# Patient Record
Sex: Male | Born: 1945 | Race: White | Hispanic: No | Marital: Married | Smoking: Never smoker
Health system: Southern US, Community
[De-identification: ages and names within clinical notes are randomized; demographics above are authoritative.]

## PROBLEM LIST (undated history)

## (undated) DIAGNOSIS — M1990 Unspecified osteoarthritis, unspecified site: Secondary | ICD-10-CM

## (undated) DIAGNOSIS — I5189 Other ill-defined heart diseases: Secondary | ICD-10-CM

## (undated) DIAGNOSIS — K635 Polyp of colon: Secondary | ICD-10-CM

## (undated) DIAGNOSIS — Z87442 Personal history of urinary calculi: Secondary | ICD-10-CM

## (undated) DIAGNOSIS — I2510 Atherosclerotic heart disease of native coronary artery without angina pectoris: Secondary | ICD-10-CM

## (undated) DIAGNOSIS — I219 Acute myocardial infarction, unspecified: Secondary | ICD-10-CM

## (undated) DIAGNOSIS — G250 Essential tremor: Secondary | ICD-10-CM

## (undated) DIAGNOSIS — D72819 Decreased white blood cell count, unspecified: Secondary | ICD-10-CM

## (undated) DIAGNOSIS — R001 Bradycardia, unspecified: Secondary | ICD-10-CM

## (undated) DIAGNOSIS — I10 Essential (primary) hypertension: Secondary | ICD-10-CM

## (undated) DIAGNOSIS — R079 Chest pain, unspecified: Secondary | ICD-10-CM

## (undated) DIAGNOSIS — G4730 Sleep apnea, unspecified: Secondary | ICD-10-CM

## (undated) DIAGNOSIS — N433 Hydrocele, unspecified: Secondary | ICD-10-CM

## (undated) DIAGNOSIS — E785 Hyperlipidemia, unspecified: Secondary | ICD-10-CM

## (undated) HISTORY — PX: FOOT SURGERY: SHX648

## (undated) HISTORY — PX: OTHER SURGICAL HISTORY: SHX169

## (undated) HISTORY — PX: CARDIAC CATHETERIZATION: SHX172

---

## 2007-07-17 ENCOUNTER — Ambulatory Visit: Payer: Self-pay | Admitting: Unknown Physician Specialty

## 2008-06-20 ENCOUNTER — Ambulatory Visit: Payer: Self-pay | Admitting: Urology

## 2008-06-27 ENCOUNTER — Ambulatory Visit: Payer: Self-pay | Admitting: Urology

## 2012-01-19 ENCOUNTER — Ambulatory Visit: Payer: Self-pay | Admitting: Cardiology

## 2012-07-31 ENCOUNTER — Ambulatory Visit: Payer: Self-pay | Admitting: Unknown Physician Specialty

## 2013-11-13 HISTORY — PX: HYDROCELE EXCISION / REPAIR: SUR1145

## 2017-07-15 ENCOUNTER — Encounter: Payer: Self-pay | Admitting: Internal Medicine

## 2017-07-15 ENCOUNTER — Other Ambulatory Visit: Payer: Self-pay

## 2017-07-15 ENCOUNTER — Emergency Department: Payer: Medicare HMO

## 2017-07-15 ENCOUNTER — Inpatient Hospital Stay
Admission: EM | Admit: 2017-07-15 | Discharge: 2017-07-18 | DRG: 251 | Disposition: A | Discharge status: Other Acute Inpatient Hospital | Payer: Medicare HMO | Attending: Internal Medicine | Admitting: Internal Medicine

## 2017-07-15 DIAGNOSIS — I25119 Atherosclerotic heart disease of native coronary artery with unspecified angina pectoris: Secondary | ICD-10-CM | POA: Diagnosis present

## 2017-07-15 DIAGNOSIS — I214 Non-ST elevation (NSTEMI) myocardial infarction: Secondary | ICD-10-CM | POA: Diagnosis not present

## 2017-07-15 DIAGNOSIS — I208 Other forms of angina pectoris: Secondary | ICD-10-CM | POA: Diagnosis present

## 2017-07-15 DIAGNOSIS — E669 Obesity, unspecified: Secondary | ICD-10-CM | POA: Diagnosis present

## 2017-07-15 DIAGNOSIS — Z6830 Body mass index (BMI) 30.0-30.9, adult: Secondary | ICD-10-CM

## 2017-07-15 DIAGNOSIS — R402414 Glasgow coma scale score 13-15, 24 hours or more after hospital admission: Secondary | ICD-10-CM | POA: Diagnosis not present

## 2017-07-15 DIAGNOSIS — Y838 Other surgical procedures as the cause of abnormal reaction of the patient, or of later complication, without mention of misadventure at the time of the procedure: Secondary | ICD-10-CM | POA: Diagnosis not present

## 2017-07-15 DIAGNOSIS — Z79899 Other long term (current) drug therapy: Secondary | ICD-10-CM | POA: Diagnosis not present

## 2017-07-15 DIAGNOSIS — Z7982 Long term (current) use of aspirin: Secondary | ICD-10-CM | POA: Diagnosis not present

## 2017-07-15 DIAGNOSIS — E785 Hyperlipidemia, unspecified: Secondary | ICD-10-CM | POA: Diagnosis present

## 2017-07-15 DIAGNOSIS — I2510 Atherosclerotic heart disease of native coronary artery without angina pectoris: Secondary | ICD-10-CM | POA: Diagnosis not present

## 2017-07-15 DIAGNOSIS — R7989 Other specified abnormal findings of blood chemistry: Secondary | ICD-10-CM

## 2017-07-15 DIAGNOSIS — R778 Other specified abnormalities of plasma proteins: Secondary | ICD-10-CM

## 2017-07-15 DIAGNOSIS — I9751 Accidental puncture and laceration of a circulatory system organ or structure during a circulatory system procedure: Secondary | ICD-10-CM | POA: Diagnosis not present

## 2017-07-15 DIAGNOSIS — I10 Essential (primary) hypertension: Secondary | ICD-10-CM | POA: Diagnosis present

## 2017-07-15 DIAGNOSIS — R001 Bradycardia, unspecified: Secondary | ICD-10-CM | POA: Diagnosis present

## 2017-07-15 HISTORY — DX: Polyp of colon: K63.5

## 2017-07-15 HISTORY — DX: Hydrocele, unspecified: N43.3

## 2017-07-15 HISTORY — DX: Non-ST elevation (NSTEMI) myocardial infarction: I21.4

## 2017-07-15 HISTORY — DX: Hyperlipidemia, unspecified: E78.5

## 2017-07-15 HISTORY — DX: Bradycardia, unspecified: R00.1

## 2017-07-15 HISTORY — DX: Essential (primary) hypertension: I10

## 2017-07-15 HISTORY — DX: Chest pain, unspecified: R07.9

## 2017-07-15 HISTORY — DX: Decreased white blood cell count, unspecified: D72.819

## 2017-07-15 LAB — COMPREHENSIVE METABOLIC PANEL
ALBUMIN: 3.7 g/dL (ref 3.5–5.0)
ALK PHOS: 45 U/L (ref 38–126)
ALT: 19 U/L (ref 17–63)
ANION GAP: 9 (ref 5–15)
AST: 20 U/L (ref 15–41)
BILIRUBIN TOTAL: 0.7 mg/dL (ref 0.3–1.2)
BUN: 20 mg/dL (ref 6–20)
CALCIUM: 9.7 mg/dL (ref 8.9–10.3)
CO2: 24 mmol/L (ref 22–32)
Chloride: 106 mmol/L (ref 101–111)
Creatinine, Ser: 1.02 mg/dL (ref 0.61–1.24)
GFR calc non Af Amer: >60 mL/min (ref >60)
GLUCOSE: 109 mg/dL — AB (ref 65–99)
Potassium: 4.5 mmol/L (ref 3.5–5.1)
Sodium: 139 mmol/L (ref 135–145)
TOTAL PROTEIN: 6.7 g/dL (ref 6.5–8.1)

## 2017-07-15 LAB — TROPONIN I
TROPONIN I: 0.11 ng/mL — AB (ref <0.03)
Troponin I: 0.14 ng/mL (ref <0.03)

## 2017-07-15 LAB — CBC WITH DIFFERENTIAL/PLATELET
BASOS PCT: 1 %
Basophils Absolute: 0 10*3/uL (ref 0–0.1)
EOS ABS: 0.1 10*3/uL (ref 0–0.7)
Eosinophils Relative: 2 %
HEMATOCRIT: 44.3 % (ref 40.0–52.0)
Hemoglobin: 14.9 g/dL (ref 13.0–18.0)
Lymphocytes Relative: 23 %
Lymphs Abs: 1 10*3/uL (ref 1.0–3.6)
MCH: 32.5 pg (ref 26.0–34.0)
MCHC: 33.6 g/dL (ref 32.0–36.0)
MCV: 96.6 fL (ref 80.0–100.0)
MONO ABS: 0.3 10*3/uL (ref 0.2–1.0)
MONOS PCT: 6 %
NEUTROS ABS: 3.1 10*3/uL (ref 1.4–6.5)
Neutrophils Relative %: 68 %
Platelets: 262 10*3/uL (ref 150–440)
RBC: 4.59 MIL/uL (ref 4.40–5.90)
RDW: 13.6 % (ref 11.5–14.5)
WBC: 4.5 10*3/uL (ref 3.8–10.6)

## 2017-07-15 LAB — PROTIME-INR
INR: 1.01
Prothrombin Time: 13.2 seconds (ref 11.4–15.2)

## 2017-07-15 LAB — HEMOGLOBIN A1C
HEMOGLOBIN A1C: 5.2 % (ref 4.8–5.6)
Mean Plasma Glucose: 102.54 mg/dL

## 2017-07-15 LAB — APTT: APTT: 28 s (ref 24–36)

## 2017-07-15 MED ORDER — ALPRAZOLAM 0.25 MG PO TABS: 0.2500 mg | ORAL_TABLET | Freq: Two times a day (BID) | ORAL | Status: DC | PRN

## 2017-07-15 MED ORDER — HEPARIN (PORCINE) IN NACL 100-0.45 UNIT/ML-% IJ SOLN
1450.0000 [IU]/h | INTRAMUSCULAR | Status: DC
Start: 2017-07-15 — End: 2017-07-18
  Administered 2017-07-15: 1250 [IU]/h via INTRAVENOUS
  Administered 2017-07-16 – 2017-07-18 (×4): 1450 [IU]/h via INTRAVENOUS
  Filled 2017-07-15 (×5): qty 250

## 2017-07-15 MED ORDER — ACETAMINOPHEN 500 MG PO TABS
1000.0000 mg | ORAL_TABLET | Freq: Once | ORAL | Status: AC
  Administered 2017-07-15: 1000 mg via ORAL
  Filled 2017-07-15: qty 2

## 2017-07-15 MED ORDER — ATORVASTATIN CALCIUM 20 MG PO TABS
80.0000 mg | ORAL_TABLET | Freq: Every day | ORAL | Status: DC
  Administered 2017-07-15 – 2017-07-17 (×3): 80 mg via ORAL
  Filled 2017-07-15 (×2): qty 4

## 2017-07-15 MED ORDER — ASPIRIN 300 MG RE SUPP: 300.0000 mg | RECTAL | Status: AC

## 2017-07-15 MED ORDER — ONDANSETRON HCL 4 MG/2ML IJ SOLN: 4.0000 mg | Freq: Four times a day (QID) | INTRAMUSCULAR | Status: DC | PRN

## 2017-07-15 MED ORDER — NITROGLYCERIN 0.4 MG SL SUBL: 0.4000 mg | SUBLINGUAL_TABLET | SUBLINGUAL | Status: DC | PRN

## 2017-07-15 MED ORDER — ACETAMINOPHEN 325 MG PO TABS: 650.0000 mg | ORAL_TABLET | ORAL | Status: DC | PRN

## 2017-07-15 MED ORDER — ZOLPIDEM TARTRATE 5 MG PO TABS: 5.0000 mg | ORAL_TABLET | Freq: Every evening | ORAL | Status: DC | PRN

## 2017-07-15 MED ORDER — LORATADINE 10 MG PO TABS
10.0000 mg | ORAL_TABLET | Freq: Every day | ORAL | Status: DC
  Administered 2017-07-15 – 2017-07-17 (×3): 10 mg via ORAL
  Filled 2017-07-15 (×3): qty 1

## 2017-07-15 MED ORDER — ASPIRIN EC 81 MG PO TBEC
81.0000 mg | DELAYED_RELEASE_TABLET | Freq: Every day | ORAL | Status: DC
  Administered 2017-07-16 – 2017-07-17 (×2): 81 mg via ORAL
  Filled 2017-07-15 (×2): qty 1

## 2017-07-15 MED ORDER — ASPIRIN 81 MG PO CHEW: 324.0000 mg | CHEWABLE_TABLET | ORAL | Status: AC

## 2017-07-15 MED ORDER — HEPARIN BOLUS VIA INFUSION
4000.0000 [IU] | Freq: Once | INTRAVENOUS | Status: AC
  Administered 2017-07-15: 4000 [IU] via INTRAVENOUS
  Filled 2017-07-15: qty 4000

## 2017-07-15 NOTE — ED Triage Notes (Signed)
Pt presents today from home via [facility] for chest pain. Pt describes pain as elephant sitting on my chest. Pt states that pain is gone now. Pt received 300 ml of fl 324 asprin and 0.4 nitro sprays x3. Pt is NAD

## 2017-07-15 NOTE — ED Notes (Signed)
Date and time results received: 07/15/17 1511 (use smartphrase ".now" to insert current time)  Test: troponin Critical Value: 0.14  Name of Provider Notified: Quentin Cornwall

## 2017-07-15 NOTE — Progress Notes (Signed)
[facility] for Heparin Drip Indication: chest pain/ACS  Not on File  Patient Measurements: Height: 5\' 9"  (175.3 cm) Weight: 222 lb 14.4 oz (101.1 kg) IBW/kg (Calculated) : 70.7 Heparin Dosing Weight: 92 kg  Vital Signs: Temp: 97.9 F (36.6 C) (03/22 0951) Temp Source: Oral (03/22 0951) BP: 127/67 (03/22 1500) Pulse Rate: 51 (03/22 1500)  Labs: Recent Labs    07/15/17 0952 07/15/17 1436  HGB 14.9  --   HCT 44.3  --   PLT 262  --   CREATININE 1.02  --   TROPONINI <0.03 0.14*    Estimated Creatinine Clearance: 75.6 mL/min (by C-G formula based on SCr of 1.02 mg/dL).   Medical History: No past medical history on file.  Assessment: 72 y/o M admitted with CP not on anticoagulant PTA.   Goal of Therapy:  Heparin level 0.3-0.7 units/ml Monitor platelets by anticoagulation protocol: Yes   Plan:  Give 4000 units bolus x 1 Start heparin infusion at 1250 units/hr Check anti-Xa level in 8 hours and daily while on heparin Continue to monitor H&H and platelets  Ulice Dash D 07/15/2017,3:29 PM

## 2017-07-15 NOTE — Progress Notes (Signed)
Patient admitted to unit. Oriented to room, call bell, and staff. Bed in lowest position. Fall safety plan reviewed. Full assessment to Epic. Skin assessment verified with Georga Hacking RN. Telemetry box verification with tele clerk and Mykia NT- Box#: [id number] Will continue to monitor.

## 2017-07-15 NOTE — ED Provider Notes (Signed)
[facility] Emergency Department Provider Note    None    (approximate)  I have reviewed the triage vital signs and the nursing notes.   HISTORY  Chief Complaint Chest Pain    HPI Jeremiah [name] is a 72 y.o. male with a history of reflux as well as hypertension hypercholesterolemia but no personal history of cardiac disease presents with chief complaint of sudden onset of midsternal pressure described as an elephant sitting on his chest that lasted roughly 15-20 minutes.  Did feel flushed.  And the discomfort actually brought him down to the ground and knees.  Has never had pain like this quite before.  Took some Tums with no improvement.  He called EMS and EMS gave him nitroglycerin with improvement in symptoms.  Given aspirin in route.  States his pain is currently minimal on left side.  Denies any pain tearing or ripping through to his back.  No pain in his jaw.  No recent fevers.  He had a presentation to [facility] ER in 2016 where he was admitted for cardiac rule out and had negative dobutamine stress test and was released home but states that this symptom feels slightly different as he had more pressure in his chest and symptoms lasted longer.   No past medical history on file. No family history on file.  There are no active problems to display for this patient.     Prior to Admission medications   Medication Sig Start Date End Date Taking? Authorizing Provider  aspirin EC 81 MG tablet Take 81 mg by mouth daily.   Yes [provider]  cetirizine (ZYRTEC) 10 MG tablet Take 10 mg by mouth daily.   Yes [provider]  cholecalciferol (VITAMIN D) 1000 units tablet Take 1,000 Units by mouth daily.   Yes [provider]  Omega-3 Fatty Acids (FISH OIL) 1000 MG CAPS Take 1 capsule by mouth daily.   Yes [provider]  simvastatin (ZOCOR) 20 MG tablet Take 20 mg by mouth daily.   Yes [provider]  vitamin B-12  (CYANOCOBALAMIN) 1000 MCG tablet Take 1,000 mcg by mouth daily.   Yes [provider]    Allergies Patient has no allergy information on record.    Social History Social History   Tobacco Use  . Smoking status: Not on file  Substance Use Topics  . Alcohol use: Not on file  . Drug use: Not on file    Review of Systems Patient denies headaches, rhinorrhea, blurry vision, numbness, shortness of breath, chest pain, edema, cough, abdominal pain, nausea, vomiting, diarrhea, dysuria, fevers, rashes or hallucinations unless otherwise stated above in HPI. ____________________________________________   PHYSICAL EXAM:  VITAL SIGNS: Vitals:   07/15/17 1330 07/15/17 1500  BP: 139/80 127/67  Pulse: (!) 55 (!) 51  Resp: (!) 24 16  Temp:    SpO2: 96% 96%    Constitutional: Alert and oriented. Well appearing and in no acute distress. Eyes: Conjunctivae are normal.  Head: Atraumatic. Nose: No congestion/rhinnorhea. Mouth/Throat: Mucous membranes are moist.   Neck: No stridor. Painless ROM.  Cardiovascular: Normal rate, regular rhythm. Grossly normal heart sounds.  Good peripheral circulation. Respiratory: Normal respiratory effort.  No retractions. Lungs CTAB. Gastrointestinal: Soft and nontender. No distention. No abdominal bruits. No CVA tenderness. Genitourinary:  Musculoskeletal: No lower extremity tenderness nor edema.  No joint effusions. Neurologic:  Normal speech and language. No gross focal neurologic deficits are appreciated. No facial droop Skin:  Skin  is warm, dry and intact. No rash noted. Psychiatric: Mood and affect are normal. Speech and behavior are normal.  ____________________________________________   LABS (all labs ordered are listed, but only abnormal results are displayed)  Results for orders placed or performed during the hospital encounter of 07/15/17 (from the past 24 hour(s))  CBC with Differential/Platelet     Status: None   Collection Time:  07/15/17  9:52 AM  Result Value Ref Range   WBC 4.5 3.8 - 10.6 K/uL   RBC 4.59 4.40 - 5.90 MIL/uL   Hemoglobin 14.9 13.0 - 18.0 g/dL   HCT 44.3 40.0 - 52.0 %   MCV 96.6 80.0 - 100.0 fL   MCH 32.5 26.0 - 34.0 pg   MCHC 33.6 32.0 - 36.0 g/dL   RDW 13.6 11.5 - 14.5 %   Platelets 262 150 - 440 K/uL   Neutrophils Relative % 68 %   Neutro Abs 3.1 1.4 - 6.5 K/uL   Lymphocytes Relative 23 %   Lymphs Abs 1.0 1.0 - 3.6 K/uL   Monocytes Relative 6 %   Monocytes Absolute 0.3 0.2 - 1.0 K/uL   Eosinophils Relative 2 %   Eosinophils Absolute 0.1 0 - 0.7 K/uL   Basophils Relative 1 %   Basophils Absolute 0.0 0 - 0.1 K/uL  Comprehensive metabolic panel     Status: Abnormal   Collection Time: 07/15/17  9:52 AM  Result Value Ref Range   Sodium 139 135 - 145 mmol/L   Potassium 4.5 3.5 - 5.1 mmol/L   Chloride 106 101 - 111 mmol/L   CO2 24 22 - 32 mmol/L   Glucose, Bld 109 (H) 65 - 99 mg/dL   BUN 20 6 - 20 mg/dL   Creatinine, Ser 1.02 0.61 - 1.24 mg/dL   Calcium 9.7 8.9 - 10.3 mg/dL   Total Protein 6.7 6.5 - 8.1 g/dL   Albumin 3.7 3.5 - 5.0 g/dL   AST 20 15 - 41 U/L   ALT 19 17 - 63 U/L   Alkaline Phosphatase 45 38 - 126 U/L   Total Bilirubin 0.7 0.3 - 1.2 mg/dL   GFR calc non Af Amer >60 >60 mL/min   GFR calc Af Amer >60 >60 mL/min   Anion gap 9 5 - 15  Troponin I     Status: None   Collection Time: 07/15/17  9:52 AM  Result Value Ref Range   Troponin I <0.03 <0.03 ng/mL  Troponin I     Status: Abnormal   Collection Time: 07/15/17  2:36 PM  Result Value Ref Range   Troponin I 0.14 (HH) <0.03 ng/mL   ____________________________________________  EKG My review and personal interpretation at Time: 9:59   Indication: chest pain  Rate: 50  Rhythm: sinus Axis: normal  Other: normal intervals, no stemi, nonspecific st abn   My review and personal interpretation at Time: 15:09   Indication: chest pain  Rate: 50  Rhythm: sinus Axis: normal  Other: normal intervals, no stemi, nonspecific  st abn ____________________________________________  RADIOLOGY  I personally reviewed all radiographic images ordered to evaluate for the above acute complaints and reviewed radiology reports and findings.  These findings were personally discussed with the patient.  Please see medical record for radiology report.  ____________________________________________   PROCEDURES  Procedure(s) performed:  .Critical Care Performed by: Merlyn Lot, MD Authorized by: Merlyn Lot, MD   Critical care provider statement:    Critical care time (minutes):  35   Critical  care time was exclusive of:  Separately billable procedures and treating other patients   Critical care was necessary to treat or prevent imminent or life-threatening deterioration of the following conditions:  Cardiac failure   Critical care was time spent personally by me on the following activities:  Development of treatment plan with patient or surrogate, discussions with consultants, evaluation of patient's response to treatment, examination of patient, obtaining history from patient or surrogate, ordering and performing treatments and interventions, ordering and review of laboratory studies, ordering and review of radiographic studies, pulse oximetry, re-evaluation of patient's condition and review of old charts      Critical Care performed: yes  ____________________________________________   INITIAL IMPRESSION / ASSESSMENT AND PLAN / ED COURSE  Pertinent labs & imaging results that were available during my care of the patient were reviewed by me and considered in my medical decision making (see chart for details).  DDX: ACS, pericarditis, esophagitis, boerhaaves, pe, dissection, pna, bronchitis, costochondritis   Jeremiah [name] is a 72 y.o. who presents to the ED with symptoms as described above.  Patient had moderate heart score of 4.  Not clinically consistent with PE or dissection.  Abdominal exam  otherwise soft and benign.  Based on his presentation I have recommended hospitalization given his symptoms.  Patient is declining hospitalization at this time and would prefer to have serial enzymes under observation here in the ER.  Agrees that if his troponin is elevated or if he has any EKG changes that he will agree to hospitalization.  He is currently pain-free.  Clinical Course as of Jul 15 1517  Fri Jul 15, 2017  1108 Patient remains pain-free.  Initial troponin is negative.  No changes on his EKG.  Did recommend hospitalization for serial enzymes based on his increased heart score.  Patient has declined this preferring to stay observed in the ER for repeat troponin at 4 hours with repeat EKG.  I do believe that this is reasonable as he is currently pain-free and at that point we can further risk stratify likelihood of ACS.  Does have significant history of gastritis.  Plan will be to repeat troponin and EKG we will keep the patient on cardiac monitor to further determine inpatient versus outpatient referral.   [PR]  1516 Patient remains pain-free but did rule in with a troponin of 0.14.  No EKG changes but based on his symptoms we will heparinize he is already received aspirin.  Remains hemodynamically stable but I do believe patient will require admission the hospital for further medical management.   [PR]    Clinical Course User Index [PR] Merlyn Lot, MD     As part of my medical decision making, I reviewed the following data within the [facility] notes reviewed and incorporated, Labs reviewed, notes from prior ED visits and [place] Controlled Substance Database   ____________________________________________   FINAL CLINICAL IMPRESSION(S) / ED DIAGNOSES  Final diagnoses:  Angina at rest [facility])  Elevated troponin I level      NEW MEDICATIONS STARTED DURING THIS VISIT:  New Prescriptions   No medications on file     Note:  This document was  prepared using Dragon voice recognition software and may include unintentional dictation errors.    Merlyn Lot, MD 07/15/17 [id number]

## 2017-07-15 NOTE — Consult Note (Signed)
Cardiology Consult    Patient ID: Jeremiah [name] MRN: [id number], DOB/AGE: 74-Sep-1947   Admit date: 07/15/2017 Date of Consult: 07/15/2017  Primary Physician: Patient, No Pcp Per Primary Cardiologist: New - M. Fletcher Anon, MD  Requesting Provider: Q. Bridgett Larsson, MD  Patient Profile    Jeremiah [name] is a 72 y.o. male with a history of hypertension, hyperlipidemia, chest pain with normal stress test in 2016, leukopenia, and sinus bradycardia, who is being seen today for the evaluation of chest pain and non-STEMI at the request of Dr. Bridgett Larsson.  Past Medical History   Past Medical History:  Diagnosis Date  . Chest pain    a. 10/2014 Neg MV @ [facility].  . Colon polyps   . Hydrocele    a. 10/2013 s/p L epididymectomy and hyrocele repair.  Marland Kitchen Hyperlipidemia   . Hypertension   . Leukopenia   . Sinus bradycardia     Past Surgical History:  Procedure Laterality Date  . EPIDIDYMECTOMY UNILATERAL    . HYDROCELE EXCISION / REPAIR Left 11/13/2013    Allergies  Not on File  History of Present Illness    72 year old male with the above past medical history including hypertension, hyperlipidemia, and sinus bradycardia.  In 2016, he was admitted to [facility] with right upper quadrant and chest pain.  He ruled out and underwent stress testing which was negative.  He has not required cardiology follow-up.  He lives locally with his wife and is relatively active but does not necessarily exercise.  He was in his usual state of health until  this morning at 6:20 AM, when he awoke with left-sided and substernal chest pressure associated with mild nausea and dyspnea.  After taking some Tums, symptoms actually worsened.  After a few hours of ongoing symptoms, symptoms became associated with nausea and tremors in his legs.  His wife called EMS.  He was treated with multiple sublingual nitroglycerin tablets and sprays with eventual relief of symptoms.  Total duration about 3 hours.  He was taken to the [facility] ED where ECG was  nonacute.  Initial troponin was normal at less than 0.03 but subsequent troponin returned elevated at 0.14.  He is now on heparin and chest pain-free.  Home Medications      Prior to Admission medications   Medication Sig Start Date End Date Taking? Authorizing Provider  aspirin EC 81 MG tablet Take 81 mg by mouth daily.   Yes [provider]  cetirizine (ZYRTEC) 10 MG tablet Take 10 mg by mouth daily.   Yes [provider]  cholecalciferol (VITAMIN D) 1000 units tablet Take 1,000 Units by mouth daily.   Yes [provider]  Omega-3 Fatty Acids (FISH OIL) 1000 MG CAPS Take 1 capsule by mouth daily.   Yes [provider]  simvastatin (ZOCOR) 20 MG tablet Take 20 mg by mouth daily.   Yes [provider]  vitamin B-12 (CYANOCOBALAMIN) 1000 MCG tablet Take 1,000 mcg by mouth daily.   Yes [provider]     Family History    Family History  Problem Relation Age of Onset  . Heart attack Father        age 77-  died w/ sepsis @ 23.  . Cancer - Colon Father   . COPD Mother        died @ 80   indicated that his mother is deceased. He indicated that the status of his father is unknown. He indicated that his brother is alive.  Social History    Social History   Socioeconomic History  . Marital status: Married    Spouse name: Not on file  . Number of children: Not on file  . Years of education: Not on file  . Highest education level: Not on file  Occupational History  . Not on file  Social Needs  . Financial resource strain: Not on file  . Food insecurity:    Worry: Not on file    Inability: Not on file  . Transportation needs:    Medical: Not on file    Non-medical: Not on file  Tobacco Use  . Smoking status: Never Smoker  . Smokeless tobacco: Never Used  Substance and Sexual Activity  . Alcohol use: Not Currently  . Drug use: Never  . Sexual activity: Not on file  Lifestyle  . Physical activity:    Days per week:  Not on file    Minutes per session: Not on file  . Stress: Not on file  Relationships  . Social connections:    Talks on phone: Not on file    Gets together: Not on file    Attends religious service: Not on file    Active member of club or organization: Not on file    Attends meetings of clubs or organizations: Not on file    Relationship status: Not on file  . Intimate partner violence:    Fear of current or ex partner: Not on file    Emotionally abused: Not on file    Physically abused: Not on file    Forced sexual activity: Not on file  Other Topics Concern  . Not on file  Social History Narrative   Lives in [place] with wife.     Review of Systems    General:  No chills, fever, night sweats or weight changes.  Cardiovascular: +++ chest pain  associated with dyspnea and nausea, no edema, orthopnea, palpitations, paroxysmal nocturnal dyspnea. Dermatological: No rash, lesions/masses Respiratory: No cough, dyspnea Urologic: No hematuria, dysuria Abdominal:   +++ nausea, no vomiting, diarrhea, bright red blood per rectum, melena, or hematemesis Neurologic:  No visual changes, wkns, changes in mental status. All other systems reviewed and are otherwise negative except as noted above.  Physical Exam    Blood pressure 126/80, pulse (!) 56, temperature 97.9 F (36.6 C), temperature source Oral, resp. rate 13, height 5\' 9"  (1.753 m), weight 222 lb 14.4 oz (101.1 kg), SpO2 100 %.  General: Pleasant, NAD Psych: Normal affect. Neuro: Alert and oriented X 3. Moves all extremities spontaneously. HEENT: Normal  Neck: Supple without bruits or JVD. Lungs:  Resp regular and unlabored, CTA. Heart: RRR no s3, s4, or murmurs. Abdomen: Soft, non-tender, non-distended, BS + x 4.  Extremities: No clubbing, cyanosis or edema. DP/PT/Radials 2+ and equal bilaterally.  Labs     Recent Labs    07/15/17 0952 07/15/17 1436  TROPONINI <0.03 0.14*   Lab Results  Component Value Date   WBC  4.5 07/15/2017   HGB 14.9 07/15/2017   HCT 44.3 07/15/2017   MCV 96.6 07/15/2017   PLT 262 07/15/2017    Recent Labs  Lab 07/15/17 0952  NA 139  K 4.5  CL 106  CO2 24  BUN 20  CREATININE 1.02  CALCIUM 9.7  PROT 6.7  BILITOT 0.7  ALKPHOS 45  ALT 19  AST 20  GLUCOSE 109*     Radiology Studies    Dg Chest 2 View  Result  Date: 07/15/2017 CLINICAL DATA:  Chest pain. EXAM: CHEST - 2 VIEW COMPARISON:  None. FINDINGS: The heart size and mediastinal contours are within normal limits. Normal pulmonary vascularity. Atherosclerotic calcification of the aortic arch. No focal consolidation, pleural effusion, or pneumothorax. No acute osseous abnormality. IMPRESSION: 1.  No active cardiopulmonary disease. 2.  Aortic atherosclerosis (ICD10-I70.0). Electronically Signed   By: Titus Dubin M.D.   On: 07/15/2017 10:52    ECG & Cardiac Imaging    Sinus Loletha Grayer, 58 LAD, no acute ST/T changes.  Assessment & Plan    1.  Acute coronary syndrome/non-ST segment elevation myocardial infarction: Patient presented to the ED this morning with 3 hours of left-sided and substernal chest heaviness and pressure associated with dyspnea and nausea.  Symptoms improved with nitroglycerin.  Initial troponin was normal however subsequent troponin returned higher at 0.14.  He is currently symptom-free and on IV heparin.  He is being admitted by internal medicine.  Continue aspirin and statin, though will increase to high potency.  No beta-blocker in the setting of baseline bradycardia.  Plan on diagnostic catheterization on Monday with Dr. Fletcher Anon.  The patient understands that risks include but are not limited to stroke (1 in 1000), death (1 in 40), kidney failure [usually temporary] (1 in 500), bleeding (1 in 200), allergic reaction [possibly serious] (1 in 200), and agrees to proceed.    2.  Essential Hypertension: Stable.   3.  Hyperlipidemia: He is on simvastatin.  I will change this to Lipitor 80 mg in the  setting of above.  Signed, Murray Hodgkins, NP 07/15/2017, 5:03 PM  For questions or updates, please contact   Please consult [email or web address] for contact info under Cardiology/STEMI.

## 2017-07-15 NOTE — H&P (Addendum)
[facility] [place] NAME: Jeremiah [name]    MR#:  [id number]  DATE OF BIRTH:  [birth year]  DATE OF ADMISSION:  07/15/2017  PRIMARY CARE PHYSICIAN: Patient, No Pcp Per   REQUESTING/REFERRING PHYSICIAN: Dr. Quentin Cornwall  CHIEF COMPLAINT:   Chief Complaint  Patient presents with  . Chest Pain   Chest pain today. HISTORY OF PRESENT ILLNESS:  Jeremiah [name]  is a 72 y.o. male with a known history of hypertension hyperlipidemia.  The patient started to have chest heaviness without radiation this morning.  He took Tums without improvement.  In addition, he complains of nausea, but no nausea or diaphoresis.  He denies any other symptoms.  He said he had stress from family recently.  His troponin is elevated at 0.14.  Chest x-ray and EKG are unremarkable.  PAST MEDICAL HISTORY:   Past Medical History:  Diagnosis Date  . Hyperlipidemia   . Hypertension     PAST SURGICAL HISTORY:  EPIDIDYMECTOMY UNILATERAL SOCIAL HISTORY:   Social History   Tobacco Use  . Smoking status: Never Smoker  . Smokeless tobacco: Never Used  Substance Use Topics  . Alcohol use: Not on file    FAMILY HISTORY:   Family History  Problem Relation Age of Onset  . Cancer Father   . Heart attack Father   . Colon cancer Father     DRUG ALLERGIES:  Not on File  REVIEW OF SYSTEMS:   Review of Systems  Constitutional: Negative for chills, fever and malaise/fatigue.  HENT: Negative for sore throat.   Eyes: Negative for blurred vision and double vision.  Respiratory: Negative for cough, hemoptysis, shortness of breath, wheezing and stridor.   Cardiovascular: Positive for chest pain. Negative for palpitations, orthopnea and leg swelling.  Gastrointestinal: Positive for nausea. Negative for abdominal pain, blood in stool, diarrhea, melena and vomiting.  Genitourinary: Negative for dysuria, flank pain and hematuria.  Musculoskeletal: Negative for back pain and joint  pain.  Skin: Negative for rash.  Neurological: Negative for dizziness, sensory change, focal weakness, seizures, loss of consciousness, weakness and headaches.  Endo/Heme/Allergies: Negative for polydipsia.  Psychiatric/Behavioral: Negative for depression. The patient is not nervous/anxious.     MEDICATIONS AT HOME:   Prior to Admission medications   Medication Sig Start Date End Date Taking? Authorizing Provider  aspirin EC 81 MG tablet Take 81 mg by mouth daily.   Yes [provider]  cetirizine (ZYRTEC) 10 MG tablet Take 10 mg by mouth daily.   Yes [provider]  cholecalciferol (VITAMIN D) 1000 units tablet Take 1,000 Units by mouth daily.   Yes [provider]  Omega-3 Fatty Acids (FISH OIL) 1000 MG CAPS Take 1 capsule by mouth daily.   Yes [provider]  simvastatin (ZOCOR) 20 MG tablet Take 20 mg by mouth daily.   Yes [provider]  vitamin B-12 (CYANOCOBALAMIN) 1000 MCG tablet Take 1,000 mcg by mouth daily.   Yes [provider]      VITAL SIGNS:  Blood pressure 132/75, pulse (!) 55, temperature 97.9 F (36.6 C), temperature source Oral, resp. rate 17, height 5\' 9"  (1.753 m), weight 222 lb 14.4 oz (101.1 kg), SpO2 99 %.  PHYSICAL EXAMINATION:  Physical Exam  GENERAL:  72 y.o.-year-old patient lying in the bed with no acute distress.  Obesity. EYES: Pupils equal, round, reactive to light and accommodation. No scleral icterus. Extraocular muscles intact.  HEENT: Head atraumatic, normocephalic. Oropharynx and  nasopharynx clear.  NECK:  Supple, no jugular venous distention. No thyroid enlargement, no tenderness.  LUNGS: Normal breath sounds bilaterally, no wheezing, rales,rhonchi or crepitation. No use of accessory muscles of respiration.  CARDIOVASCULAR: S1, S2 normal. No murmurs, rubs, or gallops.  ABDOMEN: Soft, nontender, nondistended. Bowel sounds present. No organomegaly or mass.  EXTREMITIES: No pedal edema,  cyanosis, or clubbing.  NEUROLOGIC: Cranial nerves II through XII are intact. Muscle strength 5/5 in all extremities. Sensation intact. Gait not checked.  PSYCHIATRIC: The patient is alert and oriented x 3.  SKIN: No obvious rash, lesion, or ulcer.   LABORATORY PANEL:   CBC Recent Labs  Lab 07/15/17 0952  WBC 4.5  HGB 14.9  HCT 44.3  PLT 262   ------------------------------------------------------------------------------------------------------------------  Chemistries  Recent Labs  Lab 07/15/17 0952  NA 139  K 4.5  CL 106  CO2 24  GLUCOSE 109*  BUN 20  CREATININE 1.02  CALCIUM 9.7  AST 20  ALT 19  ALKPHOS 45  BILITOT 0.7   ------------------------------------------------------------------------------------------------------------------  Cardiac Enzymes Recent Labs  Lab 07/15/17 1436  TROPONINI 0.14*   ------------------------------------------------------------------------------------------------------------------  RADIOLOGY:  Dg Chest 2 View  Result Date: 07/15/2017 CLINICAL DATA:  Chest pain. EXAM: CHEST - 2 VIEW COMPARISON:  None. FINDINGS: The heart size and mediastinal contours are within normal limits. Normal pulmonary vascularity. Atherosclerotic calcification of the aortic arch. No focal consolidation, pleural effusion, or pneumothorax. No acute osseous abnormality. IMPRESSION: 1.  No active cardiopulmonary disease. 2.  Aortic atherosclerosis (ICD10-I70.0). Electronically Signed   By: Titus Dubin M.D.   On: 07/15/2017 10:52      IMPRESSION AND PLAN:   NSTEMI. The patient will be admitted to medical floor with telemetry monitor. Start heparin drip, give aspirin 324 mg 1 dose and then 81 mg daily from tomorrow.  Hold Zocor and start Lipitor 40 mg at bedtime.  Follow-up troponin level and cardiology consult.  Bradycardia.  Continue telemetry monitor, no beta-blocker at this time. Hypertension.  Blood pressure is controlled without hypertension  medication. Hyperlipidemia.  Start Lipitor. Obesity. I discussed with Dr. Fletcher Anon. All the records are reviewed and case discussed with ED provider. Management plans discussed with the patient, his wife and they are in agreement.  CODE STATUS: Full code  TOTAL TIME TAKING CARE OF THIS PATIENT: 56 minutes.    Demetrios Loll M.D on 07/15/2017 at 4:16 PM  Between 7am to 6pm - Pager - [phone]  After 6pm go to [email or web address] - Proofreader  [facility]  Office  [phone]  CC: Primary care physician; Patient, No Pcp Per   Note: This dictation was prepared with Dragon dictation along with smaller phrase technology. Any transcriptional errors that result from this process are unin

## 2017-07-15 NOTE — ED Notes (Signed)
PT ambulated with steady gait noted, pulse ox at 95-98% on RA, denies any dizziness or SOB.

## 2017-07-16 LAB — HEPARIN LEVEL (UNFRACTIONATED)
HEPARIN UNFRACTIONATED: 0.29 [IU]/mL — AB (ref 0.30–0.70)
Heparin Unfractionated: 0.45 IU/mL (ref 0.30–0.70)
Heparin Unfractionated: 0.47 IU/mL (ref 0.30–0.70)

## 2017-07-16 LAB — CBC
HCT: 44.9 % (ref 40.0–52.0)
Hemoglobin: 15.4 g/dL (ref 13.0–18.0)
MCH: 32.7 pg (ref 26.0–34.0)
MCHC: 34.2 g/dL (ref 32.0–36.0)
MCV: 95.6 fL (ref 80.0–100.0)
PLATELETS: 268 10*3/uL (ref 150–440)
RBC: 4.7 MIL/uL (ref 4.40–5.90)
RDW: 13.4 % (ref 11.5–14.5)
WBC: 6.1 10*3/uL (ref 3.8–10.6)

## 2017-07-16 LAB — TROPONIN I: Troponin I: 0.1 ng/mL (ref <0.03)

## 2017-07-16 MED ORDER — HEPARIN BOLUS VIA INFUSION
1400.0000 [IU] | Freq: Once | INTRAVENOUS | Status: AC
  Administered 2017-07-16: 1400 [IU] via INTRAVENOUS
  Filled 2017-07-16: qty 1400

## 2017-07-16 MED ORDER — SODIUM CHLORIDE 0.9% FLUSH
3.0000 mL | Freq: Two times a day (BID) | INTRAVENOUS | Status: DC
  Administered 2017-07-16 – 2017-07-17 (×2): 3 mL via INTRAVENOUS

## 2017-07-16 NOTE — Progress Notes (Signed)
Progress Note  Patient Name: Jeremiah [name] Date of Encounter: 07/16/2017  Primary Cardiologist: Kathlyn Sacramento, MD   Subjective   Doing well without chest pain  Inpatient Medications    Scheduled Meds: . aspirin  324 mg Oral NOW   Or  . aspirin  300 mg Rectal NOW  . aspirin EC  81 mg Oral Daily  . atorvastatin  80 mg Oral q1800  . loratadine  10 mg Oral Daily   Continuous Infusions: . heparin 1,450 Units/hr (07/16/17 0946)   PRN Meds: acetaminophen, ALPRAZolam, nitroGLYCERIN, ondansetron (ZOFRAN) IV, zolpidem   Vital Signs    Vitals:   07/15/17 1736 07/15/17 2021 07/16/17 0544 07/16/17 0801  BP: (!) 145/90 129/74 (!) 120/59 134/82  Pulse: 63 61 (!) 55 (!) 57  Resp: (!) 21 18 17 16   Temp: 97.9 F (36.6 C) 98.4 F (36.9 C) 98.3 F (36.8 C) 98.3 F (36.8 C)  TempSrc: Oral Oral Oral Oral  SpO2: 97% 97% 99% 98%  Weight: 215 lb 1.6 oz (97.6 kg)  214 lb 3.2 oz (97.2 kg)   Height: 5\' 9"  (1.753 m)       Intake/Output Summary (Last 24 hours) at 07/16/2017 1318 Last data filed at 07/16/2017 1041 Gross per 24 hour  Intake 682.21 ml  Output 800 ml  Net -117.79 ml   Filed Weights   07/15/17 0952 07/15/17 1736 07/16/17 0544  Weight: 222 lb 14.4 oz (101.1 kg) 215 lb 1.6 oz (97.6 kg) 214 lb 3.2 oz (97.2 kg)    Telemetry    Sinus rhythm- Personally Reviewed  ECG    Sinus rhythm, early transition- Personally Reviewed  Physical Exam   GEN: No acute distress.   Neck: No JVD Cardiac: RRR, no murmurs, rubs, or gallops.  Respiratory: Clear to auscultation bilaterally. GI: Soft, nontender, non-distended  MS: No edema; No deformity. Neuro:  Nonfocal  Psych: Normal affect   Labs    Chemistry Recent Labs  Lab 07/15/17 0952  NA 139  K 4.5  CL 106  CO2 24  GLUCOSE 109*  BUN 20  CREATININE 1.02  CALCIUM 9.7  PROT 6.7  ALBUMIN 3.7  AST 20  ALT 19  ALKPHOS 45  BILITOT 0.7  GFRNONAA >60  GFRAA >60  ANIONGAP 9     Hematology Recent Labs  Lab  07/15/17 0952 07/15/17 2358  WBC 4.5 6.1  RBC 4.59 4.70  HGB 14.9 15.4  HCT 44.3 44.9  MCV 96.6 95.6  MCH 32.5 32.7  MCHC 33.6 34.2  RDW 13.6 13.4  PLT 262 268    Cardiac Enzymes Recent Labs  Lab 07/15/17 0952 07/15/17 1436 07/15/17 1901 07/15/17 2358  TROPONINI <0.03 0.14* 0.11* 0.10*   No results for input(s): TROPIPOC in the last 168 hours.   BNPNo results for input(s): BNP, PROBNP in the last 168 hours.   DDimer No results for input(s): DDIMER in the last 168 hours.   Radiology    Dg Chest 2 View  Result Date: 07/15/2017 CLINICAL DATA:  Chest pain. EXAM: CHEST - 2 VIEW COMPARISON:  None. FINDINGS: The heart size and mediastinal contours are within normal limits. Normal pulmonary vascularity. Atherosclerotic calcification of the aortic arch. No focal consolidation, pleural effusion, or pneumothorax. No acute osseous abnormality. IMPRESSION: 1.  No active cardiopulmonary disease. 2.  Aortic atherosclerosis (ICD10-I70.0). Electronically Signed   By: Titus Dubin M.D.   On: 07/15/2017 10:52    Cardiac Studies   Cath pending  Patient Profile  72 y.o. male a history of hypertension, hyperlipidemia, leukopenia, sinus bradycardia presented to the hospital with chest pain found to have a non-STEMI.  Assessment & Plan    1.  Non-STEMI: Troponins have been elevated associated with substernal chest heaviness and pressure.  Currently on IV heparin and comfortable without chest pain.  On aspirin and statin.  Plan for left heart catheterization on Monday.  2.  Hypertension: Stable  3.  Hyperlipidemia: Has been switched to 80 mg of Lipitor.  For questions or updates, please contact [place] Please consult [email or web address] for contact info under Cardiology/STEMI.      Signed, Shalinda Burkholder Meredith Leeds, MD  07/16/2017, 1:18 PM

## 2017-07-16 NOTE — Progress Notes (Signed)
ANTICOAGULATION CONSULT NOTE  Pharmacy Consult for Heparin Drip Indication: chest pain/ACS  Not on File  Patient Measurements: Height: 5\' 9"  (175.3 cm) Weight: 214 lb 3.2 oz (97.2 kg) IBW/kg (Calculated) : 70.7 Heparin Dosing Weight: 92 kg  Vital Signs: Temp: 98.6 F (37 C) (03/23 2025) Temp Source: Oral (03/23 2025) BP: 130/68 (03/23 2025) Pulse Rate: 62 (03/23 2025)  Labs: Recent Labs    07/15/17 0952 07/15/17 1436 07/15/17 1530 07/15/17 1901 07/15/17 2358 07/16/17 1212 07/16/17 1959  HGB 14.9  --   --   --  15.4  --   --   HCT 44.3  --   --   --  44.9  --   --   PLT 262  --   --   --  268  --   --   APTT  --   --  28  --   --   --   --   LABPROT  --   --  13.2  --   --   --   --   INR  --   --  1.01  --   --   --   --   HEPARINUNFRC  --   --   --   --  0.29* 0.45 0.47  CREATININE 1.02  --   --   --   --   --   --   TROPONINI <0.03 0.14*  --  0.11* 0.10*  --   --     Estimated Creatinine Clearance: 74.2 mL/min (by C-G formula based on SCr of 1.02 mg/dL).   Medical History: Past Medical History:  Diagnosis Date  . Chest pain    a. 10/2014 Neg MV @ [facility].  . Colon polyps   . Hydrocele    a. 10/2013 s/p L epididymectomy and hyrocele repair.  Marland Kitchen Hyperlipidemia   . Hypertension   . Leukopenia   . Sinus bradycardia     Assessment: 72 y/o Jeremiah [name] admitted with CP not on anticoagulant PTA.   Goal of Therapy:  Heparin level 0.3-0.7 units/ml Monitor platelets by anticoagulation protocol: Yes   Plan:  Give 4000 units bolus x 1 Start heparin infusion at 1250 units/hr Check anti-Xa level in 8 hours and daily while on heparin Continue to monitor H&H and platelets   03/23 0000 heparin level 0.29. 1400 unit bolus and increase rate to 1450 units/hr. Recheck in 8 hours.   3/23  12:12  HL = 0.45.  Continue current drip rate.  Recheck HL tonight at 20:00.  3/23 19:59 HL therapeutic x 2. Continue current rate. Will recheck HL/CBC with AM labs.  Laural [name],  [facility] 07/16/2017,9:00 PM

## 2017-07-16 NOTE — H&P (View-Only) (Signed)
Progress Note  Patient Name: Jeremiah [name] Date of Encounter: 07/16/2017  Primary Cardiologist: Kathlyn Sacramento, MD   Subjective   Doing well without chest pain  Inpatient Medications    Scheduled Meds: . aspirin  324 mg Oral NOW   Or  . aspirin  300 mg Rectal NOW  . aspirin EC  81 mg Oral Daily  . atorvastatin  80 mg Oral q1800  . loratadine  10 mg Oral Daily   Continuous Infusions: . heparin 1,450 Units/hr (07/16/17 0946)   PRN Meds: acetaminophen, ALPRAZolam, nitroGLYCERIN, ondansetron (ZOFRAN) IV, zolpidem   Vital Signs    Vitals:   07/15/17 1736 07/15/17 2021 07/16/17 0544 07/16/17 0801  BP: (!) 145/90 129/74 (!) 120/59 134/82  Pulse: 63 61 (!) 55 (!) 57  Resp: (!) 21 18 17 16   Temp: 97.9 F (36.6 C) 98.4 F (36.9 C) 98.3 F (36.8 C) 98.3 F (36.8 C)  TempSrc: Oral Oral Oral Oral  SpO2: 97% 97% 99% 98%  Weight: 215 lb 1.6 oz (97.6 kg)  214 lb 3.2 oz (97.2 kg)   Height: 5\' 9"  (1.753 m)       Intake/Output Summary (Last 24 hours) at 07/16/2017 1318 Last data filed at 07/16/2017 1041 Gross per 24 hour  Intake 682.21 ml  Output 800 ml  Net -117.79 ml   Filed Weights   07/15/17 0952 07/15/17 1736 07/16/17 0544  Weight: 222 lb 14.4 oz (101.1 kg) 215 lb 1.6 oz (97.6 kg) 214 lb 3.2 oz (97.2 kg)    Telemetry    Sinus rhythm- Personally Reviewed  ECG    Sinus rhythm, early transition- Personally Reviewed  Physical Exam   GEN: No acute distress.   Neck: No JVD Cardiac: RRR, no murmurs, rubs, or gallops.  Respiratory: Clear to auscultation bilaterally. GI: Soft, nontender, non-distended  MS: No edema; No deformity. Neuro:  Nonfocal  Psych: Normal affect   Labs    Chemistry Recent Labs  Lab 07/15/17 0952  NA 139  K 4.5  CL 106  CO2 24  GLUCOSE 109*  BUN 20  CREATININE 1.02  CALCIUM 9.7  PROT 6.7  ALBUMIN 3.7  AST 20  ALT 19  ALKPHOS 45  BILITOT 0.7  GFRNONAA >60  GFRAA >60  ANIONGAP 9     Hematology Recent Labs  Lab  07/15/17 0952 07/15/17 2358  WBC 4.5 6.1  RBC 4.59 4.70  HGB 14.9 15.4  HCT 44.3 44.9  MCV 96.6 95.6  MCH 32.5 32.7  MCHC 33.6 34.2  RDW 13.6 13.4  PLT 262 268    Cardiac Enzymes Recent Labs  Lab 07/15/17 0952 07/15/17 1436 07/15/17 1901 07/15/17 2358  TROPONINI <0.03 0.14* 0.11* 0.10*   No results for input(s): TROPIPOC in the last 168 hours.   BNPNo results for input(s): BNP, PROBNP in the last 168 hours.   DDimer No results for input(s): DDIMER in the last 168 hours.   Radiology    Dg Chest 2 View  Result Date: 07/15/2017 CLINICAL DATA:  Chest pain. EXAM: CHEST - 2 VIEW COMPARISON:  None. FINDINGS: The heart size and mediastinal contours are within normal limits. Normal pulmonary vascularity. Atherosclerotic calcification of the aortic arch. No focal consolidation, pleural effusion, or pneumothorax. No acute osseous abnormality. IMPRESSION: 1.  No active cardiopulmonary disease. 2.  Aortic atherosclerosis (ICD10-I70.0). Electronically Signed   By: Titus Dubin M.D.   On: 07/15/2017 10:52    Cardiac Studies   Cath pending  Patient Profile  72 y.o. male a history of hypertension, hyperlipidemia, leukopenia, sinus bradycardia presented to the hospital with chest pain found to have a non-STEMI.  Assessment & Plan    1.  Non-STEMI: Troponins have been elevated associated with substernal chest heaviness and pressure.  Currently on IV heparin and comfortable without chest pain.  On aspirin and statin.  Plan for left heart catheterization on Monday.  2.  Hypertension: Stable  3.  Hyperlipidemia: Has been switched to 80 mg of Lipitor.  For questions or updates, please contact [place] Please consult [email or web address] for contact info under Cardiology/STEMI.      Signed, Elijahjames Fuelling Meredith Leeds, MD  07/16/2017, 1:18 PM

## 2017-07-16 NOTE — Progress Notes (Signed)
ANTICOAGULATION CONSULT NOTE  Pharmacy Consult for Heparin Drip Indication: chest pain/ACS  Not on File  Patient Measurements: Height: 5\' 9"  (175.3 cm) Weight: 214 lb 3.2 oz (97.2 kg) IBW/kg (Calculated) : 70.7 Heparin Dosing Weight: 92 kg  Vital Signs: Temp: 98.3 F (36.8 C) (03/23 0801) Temp Source: Oral (03/23 0801) BP: 134/82 (03/23 0801) Pulse Rate: 57 (03/23 0801)  Labs: Recent Labs    07/15/17 0952 07/15/17 1436 07/15/17 1530 07/15/17 1901 07/15/17 2358 07/16/17 1212  HGB 14.9  --   --   --  15.4  --   HCT 44.3  --   --   --  44.9  --   PLT 262  --   --   --  268  --   APTT  --   --  28  --   --   --   LABPROT  --   --  13.2  --   --   --   INR  --   --  1.01  --   --   --   HEPARINUNFRC  --   --   --   --  0.29* 0.45  CREATININE 1.02  --   --   --   --   --   TROPONINI <0.03 0.14*  --  0.11* 0.10*  --     Estimated Creatinine Clearance: 74.2 mL/min (by C-G formula based on SCr of 1.02 mg/dL).   Medical History: Past Medical History:  Diagnosis Date  . Chest pain    a. 10/2014 Neg MV @ [facility].  . Colon polyps   . Hydrocele    a. 10/2013 s/p L epididymectomy and hyrocele repair.  Marland Kitchen Hyperlipidemia   . Hypertension   . Leukopenia   . Sinus bradycardia     Assessment: 72 y/o M admitted with CP not on anticoagulant PTA.   Goal of Therapy:  Heparin level 0.3-0.7 units/ml Monitor platelets by anticoagulation protocol: Yes   Plan:  Give 4000 units bolus x 1 Start heparin infusion at 1250 units/hr Check anti-Xa level in 8 hours and daily while on heparin Continue to monitor H&H and platelets   03/23 0000 heparin level 0.29. 1400 unit bolus and increase rate to 1450 units/hr. Recheck in 8 hours.   3/23  12:12  HL = 0.45.  Continue current drip rate.  Recheck HL tonight at 20:00.  Olivia Canter, [place] 07/16/2017,1:05 PM

## 2017-07-16 NOTE — Progress Notes (Signed)
ANTICOAGULATION CONSULT NOTE  Pharmacy Consult for Heparin Drip Indication: chest pain/ACS  Not on File  Patient Measurements: Height: 5\' 9"  (175.3 cm) Weight: 215 lb 1.6 oz (97.6 kg) IBW/kg (Calculated) : 70.7 Heparin Dosing Weight: 92 kg  Vital Signs: Temp: 98.4 F (36.9 C) (03/22 2021) Temp Source: Oral (03/22 2021) BP: 129/74 (03/22 2021) Pulse Rate: 61 (03/22 2021)  Labs: Recent Labs    07/15/17 0952 07/15/17 1436 07/15/17 1530 07/15/17 1901 07/15/17 2358  HGB 14.9  --   --   --  15.4  HCT 44.3  --   --   --  44.9  PLT 262  --   --   --  268  APTT  --   --  28  --   --   LABPROT  --   --  13.2  --   --   INR  --   --  1.01  --   --   HEPARINUNFRC  --   --   --   --  0.29*  CREATININE 1.02  --   --   --   --   TROPONINI <0.03 0.14*  --  0.11* 0.10*    Estimated Creatinine Clearance: 74.4 mL/min (by C-G formula based on SCr of 1.02 mg/dL).   Medical History: Past Medical History:  Diagnosis Date  . Chest pain    a. 10/2014 Neg MV @ [facility].  . Colon polyps   . Hydrocele    a. 10/2013 s/p L epididymectomy and hyrocele repair.  Marland Kitchen Hyperlipidemia   . Hypertension   . Leukopenia   . Sinus bradycardia     Assessment: 72 y/o M admitted with CP not on anticoagulant PTA.   Goal of Therapy:  Heparin level 0.3-0.7 units/ml Monitor platelets by anticoagulation protocol: Yes   Plan:  Give 4000 units bolus x 1 Start heparin infusion at 1250 units/hr Check anti-Xa level in 8 hours and daily while on heparin Continue to monitor H&H and platelets   03/23 0000 heparin level 0.29. 1400 unit bolus and increase rate to 1450 units/hr. Recheck in 8 hours.  Gianna Calef S 07/16/2017,3:35 AM

## 2017-07-16 NOTE — Progress Notes (Signed)
[facility] [place] NAME: Jeremiah [name]    MR#:  [id number]  DATE OF BIRTH:  1945/11/07  SUBJECTIVE:  CHIEF COMPLAINT:   Chief Complaint  Patient presents with  . Chest Pain   The patient has no complaints, on heparin drip. REVIEW OF SYSTEMS:  Review of Systems  Constitutional: Negative for chills, fever and malaise/fatigue.  HENT: Negative for sore throat.   Eyes: Negative for blurred vision and double vision.  Respiratory: Negative for cough, hemoptysis, shortness of breath, wheezing and stridor.   Cardiovascular: Negative for chest pain, palpitations, orthopnea and leg swelling.  Gastrointestinal: Negative for abdominal pain, blood in stool, diarrhea, melena, nausea and vomiting.  Genitourinary: Negative for dysuria, flank pain and hematuria.  Musculoskeletal: Negative for back pain and joint pain.  Neurological: Negative for dizziness, sensory change, focal weakness, seizures, loss of consciousness, weakness and headaches.  Endo/Heme/Allergies: Negative for polydipsia.  Psychiatric/Behavioral: Negative for depression. The patient is not nervous/anxious.     DRUG ALLERGIES:  Not on File VITALS:  Blood pressure 134/82, pulse (!) 57, temperature 98.3 F (36.8 C), temperature source Oral, resp. rate 16, height 5\' 9"  (1.753 m), weight 214 lb 3.2 oz (97.2 kg), SpO2 98 %. PHYSICAL EXAMINATION:  Physical Exam  Constitutional: He is oriented to person, place, and time and well-developed, well-nourished, and in no distress.  Obesity.  HENT:  Head: Normocephalic.  Mouth/Throat: Oropharynx is clear and moist.  Eyes: Pupils are equal, round, and reactive to light. Conjunctivae and EOM are normal. No scleral icterus.  Neck: Normal range of motion. Neck supple. No JVD present. No tracheal deviation present.  Cardiovascular: Normal rate, regular rhythm and normal heart sounds. Exam reveals no gallop.  No murmur heard. Pulmonary/Chest:  Effort normal and breath sounds normal. No respiratory distress. He has no wheezes. He has no rales.  Abdominal: Soft. Bowel sounds are normal. He exhibits no distension. There is no tenderness. There is no rebound.  Musculoskeletal: Normal range of motion. He exhibits no edema or tenderness.  Neurological: He is alert and oriented to person, place, and time. No cranial nerve deficit.  Skin: No rash noted. No erythema.  Psychiatric: Affect normal.   LABORATORY PANEL:  Male CBC Recent Labs  Lab 07/15/17 2358  WBC 6.1  HGB 15.4  HCT 44.9  PLT 268   ------------------------------------------------------------------------------------------------------------------ Chemistries  Recent Labs  Lab 07/15/17 0952  NA 139  K 4.5  CL 106  CO2 24  GLUCOSE 109*  BUN 20  CREATININE 1.02  CALCIUM 9.7  AST 20  ALT 19  ALKPHOS 45  BILITOT 0.7   RADIOLOGY:  No results found. ASSESSMENT AND PLAN:   NSTEMI. Continue heparin drip, aspirin 81 mg daily and Lipitor 80 mg at bedtime.  Cardiac cath on Monday per Dr. Fletcher Anon.  Bradycardia.  Continue telemetry monitor, no beta-blocker at this time. Hypertension.  Blood pressure is controlled without hypertension medication. Hyperlipidemia.  Started Lipitor.  Discontinued the Zocor. Obesity.  All the records are reviewed and case discussed with Care Management/Social Worker. Management plans discussed with the patient, his wife and the son and they are in agreement.  CODE STATUS: Full Code  TOTAL TIME TAKING CARE OF THIS PATIENT: 28 minutes.   More than 50% of the time was spent in counseling/coordination of care: YES  POSSIBLE D/C IN 2 DAYS, DEPENDING ON CLINICAL CONDITION.   Demetrios Loll M.D on 07/16/2017 at 3:35 PM  Between 7am to 6pm -  Pager - [phone]  After 6pm go to [email or web address] - [place] [facility]

## 2017-07-17 LAB — CBC
HEMATOCRIT: 44.6 % (ref 40.0–52.0)
HEMOGLOBIN: 15.3 g/dL (ref 13.0–18.0)
MCH: 32.8 pg (ref 26.0–34.0)
MCHC: 34.2 g/dL (ref 32.0–36.0)
MCV: 95.9 fL (ref 80.0–100.0)
Platelets: 250 10*3/uL (ref 150–440)
RBC: 4.65 MIL/uL (ref 4.40–5.90)
RDW: 13.5 % (ref 11.5–14.5)
WBC: 5.2 10*3/uL (ref 3.8–10.6)

## 2017-07-17 LAB — HEPARIN LEVEL (UNFRACTIONATED): HEPARIN UNFRACTIONATED: 0.4 [IU]/mL (ref 0.30–0.70)

## 2017-07-17 NOTE — Plan of Care (Signed)
Patient has no complaints of chest pain. Educated patient about cardiac catherization.

## 2017-07-17 NOTE — Progress Notes (Signed)
[facility] at [place] NAME: Jeremiah [name]    MR#:  [id number]  DATE OF BIRTH:  1946-01-11  SUBJECTIVE:  CHIEF COMPLAINT:   Chief Complaint  Patient presents with  . Chest Pain   The patient has no complaints, on heparin drip. REVIEW OF SYSTEMS:  Review of Systems  Constitutional: Negative for chills, fever and malaise/fatigue.  HENT: Negative for sore throat.   Eyes: Negative for blurred vision and double vision.  Respiratory: Negative for cough, hemoptysis, shortness of breath, wheezing and stridor.   Cardiovascular: Negative for chest pain, palpitations, orthopnea and leg swelling.  Gastrointestinal: Negative for abdominal pain, blood in stool, diarrhea, melena, nausea and vomiting.  Genitourinary: Negative for dysuria, flank pain and hematuria.  Musculoskeletal: Negative for back pain and joint pain.  Neurological: Negative for dizziness, sensory change, focal weakness, seizures, loss of consciousness, weakness and headaches.  Endo/Heme/Allergies: Negative for polydipsia.  Psychiatric/Behavioral: Negative for depression. The patient is not nervous/anxious.     DRUG ALLERGIES:  Not on File VITALS:  Blood pressure 124/69, pulse (!) 53, temperature 98.3 F (36.8 C), temperature source Oral, resp. rate 16, height 5\' 9"  (1.753 m), weight 211 lb (95.7 kg), SpO2 98 %. PHYSICAL EXAMINATION:  Physical Exam  Constitutional: He is oriented to person, place, and time and well-developed, well-nourished, and in no distress.  Obesity.  HENT:  Head: Normocephalic.  Mouth/Throat: Oropharynx is clear and moist.  Eyes: Pupils are equal, round, and reactive to light. Conjunctivae and EOM are normal. No scleral icterus.  Neck: Normal range of motion. Neck supple. No JVD present. No tracheal deviation present.  Cardiovascular: Normal rate, regular rhythm and normal heart sounds. Exam reveals no gallop.  No murmur heard. Pulmonary/Chest: Effort  normal and breath sounds normal. No respiratory distress. He has no wheezes. He has no rales.  Abdominal: Soft. Bowel sounds are normal. He exhibits no distension. There is no tenderness. There is no rebound.  Musculoskeletal: Normal range of motion. He exhibits no edema or tenderness.  Neurological: He is alert and oriented to person, place, and time. No cranial nerve deficit.  Skin: No rash noted. No erythema.  Psychiatric: Affect normal.   LABORATORY PANEL:  Male CBC Recent Labs  Lab 07/17/17 0426  WBC 5.2  HGB 15.3  HCT 44.6  PLT 250   ------------------------------------------------------------------------------------------------------------------ Chemistries  Recent Labs  Lab 07/15/17 0952  NA 139  K 4.5  CL 106  CO2 24  GLUCOSE 109*  BUN 20  CREATININE 1.02  CALCIUM 9.7  AST 20  ALT 19  ALKPHOS 45  BILITOT 0.7   RADIOLOGY:  No results found. ASSESSMENT AND PLAN:   NSTEMI. Continue heparin drip, aspirin 81 mg daily and Lipitor 80 mg at bedtime.  Cardiac cath tomorrow.  Bradycardia.  Still bradycardic, Continue telemetry monitor, no beta-blocker at this time.  Hypertension.  Blood pressure is controlled without hypertension medication.  Hyperlipidemia.  Started Lipitor.  Discontinued the Zocor. Obesity.  All the records are reviewed and case discussed with Care Management/Social Worker. Management plans discussed with the patient, his wife and the son and they are in agreement.  CODE STATUS: Full Code  TOTAL TIME TAKING CARE OF THIS PATIENT: 28 minutes.   More than 50% of the time was spent in counseling/coordination of care: YES  POSSIBLE D/C IN 1-2 DAYS, DEPENDING ON CLINICAL CONDITION.   Demetrios Loll M.D on 07/17/2017 at 4:39 PM  Between 7am to 6pm - Pager -  [phone]  After 6pm go to [email or web address] - [facility]

## 2017-07-17 NOTE — Progress Notes (Signed)
ANTICOAGULATION CONSULT NOTE  Pharmacy Consult for Heparin Drip Indication: chest pain/ACS  Not on File  Patient Measurements: Height: 5\' 9"  (175.3 cm) Weight: 214 lb 3.2 oz (97.2 kg) IBW/kg (Calculated) : 70.7 Heparin Dosing Weight: 92 kg  Vital Signs: Temp: 98.6 F (37 C) (03/23 2025) Temp Source: Oral (03/23 2025) BP: 130/68 (03/23 2025) Pulse Rate: 62 (03/23 2025)  Labs: Recent Labs    07/15/17 0952 07/15/17 1436 07/15/17 1530 07/15/17 1901  07/15/17 2358 07/16/17 1212 07/16/17 1959 07/17/17 0426  HGB 14.9  --   --   --   --  15.4  --   --  15.3  HCT 44.3  --   --   --   --  44.9  --   --  44.6  PLT 262  --   --   --   --  268  --   --  250  APTT  --   --  28  --   --   --   --   --   --   LABPROT  --   --  13.2  --   --   --   --   --   --   INR  --   --  1.01  --   --   --   --   --   --   HEPARINUNFRC  --   --   --   --    < > 0.29* 0.45 0.47 0.40  CREATININE 1.02  --   --   --   --   --   --   --   --   TROPONINI <0.03 0.14*  --  0.11*  --  0.10*  --   --   --    < > = values in this interval not displayed.    Estimated Creatinine Clearance: 74.2 mL/min (by C-G formula based on SCr of 1.02 mg/dL).   Medical History: Past Medical History:  Diagnosis Date  . Chest pain    a. 10/2014 Neg MV @ [facility].  . Colon polyps   . Hydrocele    a. 10/2013 s/p L epididymectomy and hyrocele repair.  Marland Kitchen Hyperlipidemia   . Hypertension   . Leukopenia   . Sinus bradycardia     Assessment: 73 y/o M admitted with CP not on anticoagulant PTA.   Goal of Therapy:  Heparin level 0.3-0.7 units/ml Monitor platelets by anticoagulation protocol: Yes   Plan:  Give 4000 units bolus x 1 Start heparin infusion at 1250 units/hr Check anti-Xa level in 8 hours and daily while on heparin Continue to monitor H&H and platelets   03/23 0000 heparin level 0.29. 1400 unit bolus and increase rate to 1450 units/hr. Recheck in 8 hours.   3/23  12:12  HL = 0.45.  Continue current drip  rate.  Recheck HL tonight at 20:00.  3/23 19:59 HL therapeutic x 2. Continue current rate. Will recheck HL/CBC with AM labs.  3/24 AM heparin level 0.40. Continue current regimen. Recheck heparin level and CBC with tomorrow AM labs.  Xitlalli Newhard S, RPH 07/17/2017,5:41 AM

## 2017-07-18 ENCOUNTER — Other Ambulatory Visit: Payer: Self-pay

## 2017-07-18 ENCOUNTER — Encounter (HOSPITAL_COMMUNITY): Payer: Self-pay

## 2017-07-18 ENCOUNTER — Inpatient Hospital Stay (HOSPITAL_COMMUNITY)
Admit: 2017-07-18 | Discharge: 2017-07-18 | Disposition: A | Discharge status: Home / Self Care | Payer: Medicare HMO | Attending: Cardiovascular Disease | Admitting: Cardiovascular Disease

## 2017-07-18 ENCOUNTER — Inpatient Hospital Stay (HOSPITAL_COMMUNITY)
Admission: EM | Admit: 2017-07-18 | Discharge: 2017-07-21 | DRG: 282 | Disposition: A | Discharge status: Home / Self Care | Payer: Medicare HMO | Source: Other Acute Inpatient Hospital | Attending: Interventional Cardiology | Admitting: Interventional Cardiology

## 2017-07-18 ENCOUNTER — Encounter: Payer: Self-pay | Admitting: Emergency Medicine

## 2017-07-18 ENCOUNTER — Encounter
Admission: EM | Disposition: A | Discharge status: Other Acute Inpatient Hospital | Payer: Self-pay | Source: Home / Self Care | Attending: Internal Medicine

## 2017-07-18 ENCOUNTER — Inpatient Hospital Stay (HOSPITAL_COMMUNITY): Payer: Medicare HMO

## 2017-07-18 DIAGNOSIS — Z800 Family history of malignant neoplasm of digestive organs: Secondary | ICD-10-CM

## 2017-07-18 DIAGNOSIS — I2510 Atherosclerotic heart disease of native coronary artery without angina pectoris: Secondary | ICD-10-CM | POA: Diagnosis present

## 2017-07-18 DIAGNOSIS — Z6831 Body mass index (BMI) 31.0-31.9, adult: Secondary | ICD-10-CM | POA: Diagnosis not present

## 2017-07-18 DIAGNOSIS — Z955 Presence of coronary angioplasty implant and graft: Secondary | ICD-10-CM | POA: Diagnosis not present

## 2017-07-18 DIAGNOSIS — I214 Non-ST elevation (NSTEMI) myocardial infarction: Principal | ICD-10-CM | POA: Diagnosis present

## 2017-07-18 DIAGNOSIS — I9788 Other intraoperative complications of the circulatory system, not elsewhere classified: Secondary | ICD-10-CM | POA: Diagnosis not present

## 2017-07-18 DIAGNOSIS — Z79899 Other long term (current) drug therapy: Secondary | ICD-10-CM

## 2017-07-18 DIAGNOSIS — I219 Acute myocardial infarction, unspecified: Secondary | ICD-10-CM | POA: Diagnosis not present

## 2017-07-18 DIAGNOSIS — I2102 ST elevation (STEMI) myocardial infarction involving left anterior descending coronary artery: Secondary | ICD-10-CM

## 2017-07-18 DIAGNOSIS — E785 Hyperlipidemia, unspecified: Secondary | ICD-10-CM | POA: Diagnosis present

## 2017-07-18 DIAGNOSIS — I313 Pericardial effusion (noninflammatory): Secondary | ICD-10-CM | POA: Diagnosis not present

## 2017-07-18 DIAGNOSIS — I351 Nonrheumatic aortic (valve) insufficiency: Secondary | ICD-10-CM

## 2017-07-18 DIAGNOSIS — Z7982 Long term (current) use of aspirin: Secondary | ICD-10-CM

## 2017-07-18 DIAGNOSIS — Z8249 Family history of ischemic heart disease and other diseases of the circulatory system: Secondary | ICD-10-CM

## 2017-07-18 DIAGNOSIS — I10 Essential (primary) hypertension: Secondary | ICD-10-CM | POA: Diagnosis present

## 2017-07-18 DIAGNOSIS — E669 Obesity, unspecified: Secondary | ICD-10-CM | POA: Diagnosis present

## 2017-07-18 DIAGNOSIS — R001 Bradycardia, unspecified: Secondary | ICD-10-CM | POA: Diagnosis present

## 2017-07-18 DIAGNOSIS — I208 Other forms of angina pectoris: Secondary | ICD-10-CM | POA: Diagnosis not present

## 2017-07-18 HISTORY — PX: LEFT HEART CATH AND CORONARY ANGIOGRAPHY: CATH118249

## 2017-07-18 HISTORY — PX: CORONARY STENT INTERVENTION: CATH118234

## 2017-07-18 LAB — BASIC METABOLIC PANEL
Anion gap: 10 (ref 5–15)
BUN: 16 mg/dL (ref 6–20)
CALCIUM: 8.8 mg/dL — AB (ref 8.9–10.3)
CHLORIDE: 107 mmol/L (ref 101–111)
CO2: 21 mmol/L — AB (ref 22–32)
CREATININE: 1.06 mg/dL (ref 0.61–1.24)
GFR calc Af Amer: >60 mL/min (ref >60)
GFR calc non Af Amer: >60 mL/min (ref >60)
GLUCOSE: 122 mg/dL — AB (ref 65–99)
Potassium: 3.9 mmol/L (ref 3.5–5.1)
Sodium: 138 mmol/L (ref 135–145)

## 2017-07-18 LAB — HEPARIN LEVEL (UNFRACTIONATED): HEPARIN UNFRACTIONATED: 0.36 [IU]/mL (ref 0.30–0.70)

## 2017-07-18 LAB — CBC
HCT: 45.4 % (ref 40.0–52.0)
Hemoglobin: 15.4 g/dL (ref 13.0–18.0)
MCH: 32.3 pg (ref 26.0–34.0)
MCHC: 33.9 g/dL (ref 32.0–36.0)
MCV: 95.2 fL (ref 80.0–100.0)
PLATELETS: 265 10*3/uL (ref 150–440)
RBC: 4.76 MIL/uL (ref 4.40–5.90)
RDW: 13.4 % (ref 11.5–14.5)
WBC: 5.6 10*3/uL (ref 3.8–10.6)

## 2017-07-18 LAB — ECHOCARDIOGRAM COMPLETE
Height: 69 in
WEIGHTICAEL: 3344 [oz_av]

## 2017-07-18 LAB — ECHOCARDIOGRAM LIMITED
Height: 69 in
WEIGHTICAEL: 3344 [oz_av]

## 2017-07-18 LAB — POCT ACTIVATED CLOTTING TIME: Activated Clotting Time: 252 seconds

## 2017-07-18 LAB — MRSA PCR SCREENING: MRSA BY PCR: NEGATIVE

## 2017-07-18 SURGERY — LEFT HEART CATH AND CORONARY ANGIOGRAPHY
Anesthesia: Moderate Sedation

## 2017-07-18 MED ORDER — ONDANSETRON HCL 4 MG/2ML IJ SOLN
4.0000 mg | Freq: Four times a day (QID) | INTRAMUSCULAR | Status: DC | PRN
  Administered 2017-07-18: 4 mg via INTRAVENOUS
  Filled 2017-07-18: qty 2

## 2017-07-18 MED ORDER — ASPIRIN 81 MG PO CHEW
81.0000 mg | CHEWABLE_TABLET | ORAL | Status: AC
  Administered 2017-07-18: 81 mg via ORAL

## 2017-07-18 MED ORDER — ACETAMINOPHEN 325 MG PO TABS
650.0000 mg | ORAL_TABLET | ORAL | Status: DC | PRN
  Administered 2017-07-19 – 2017-07-21 (×2): 650 mg via ORAL
  Filled 2017-07-18 (×2): qty 2

## 2017-07-18 MED ORDER — ATORVASTATIN CALCIUM 80 MG PO TABS
80.0000 mg | ORAL_TABLET | Freq: Every day | ORAL | Status: DC
Start: 2017-07-18 — End: 2017-07-21
  Administered 2017-07-18 – 2017-07-20 (×3): 80 mg via ORAL
  Filled 2017-07-18 (×3): qty 1

## 2017-07-18 MED ORDER — FENTANYL CITRATE (PF) 100 MCG/2ML IJ SOLN
INTRAMUSCULAR | Status: AC
  Filled 2017-07-18: qty 2

## 2017-07-18 MED ORDER — MORPHINE SULFATE (PF) 2 MG/ML IV SOLN
INTRAVENOUS | Status: AC
  Administered 2017-07-18: 2 mg via INTRAVENOUS
  Filled 2017-07-18: qty 1

## 2017-07-18 MED ORDER — ASPIRIN 81 MG PO CHEW
CHEWABLE_TABLET | ORAL | Status: AC
  Filled 2017-07-18: qty 3

## 2017-07-18 MED ORDER — FENTANYL CITRATE (PF) 100 MCG/2ML IJ SOLN
INTRAMUSCULAR | Status: DC | PRN
  Administered 2017-07-18: 25 ug via INTRAVENOUS
  Administered 2017-07-18: 50 ug via INTRAVENOUS
  Administered 2017-07-18: 25 ug via INTRAVENOUS
  Administered 2017-07-18 (×2): 50 ug via INTRAVENOUS

## 2017-07-18 MED ORDER — PROTAMINE SULFATE 10 MG/ML IV SOLN
INTRAVENOUS | Status: AC
  Filled 2017-07-18: qty 5

## 2017-07-18 MED ORDER — SODIUM CHLORIDE 0.9% FLUSH: 3.0000 mL | INTRAVENOUS | Status: DC | PRN

## 2017-07-18 MED ORDER — ASPIRIN EC 81 MG PO TBEC
81.0000 mg | DELAYED_RELEASE_TABLET | Freq: Every day | ORAL | Status: DC
  Administered 2017-07-19 – 2017-07-21 (×3): 81 mg via ORAL
  Filled 2017-07-18 (×3): qty 1

## 2017-07-18 MED ORDER — ASPIRIN 81 MG PO CHEW
CHEWABLE_TABLET | ORAL | Status: AC
  Filled 2017-07-18: qty 1

## 2017-07-18 MED ORDER — TICAGRELOR 90 MG PO TABS
ORAL_TABLET | ORAL | Status: AC
  Filled 2017-07-18: qty 2

## 2017-07-18 MED ORDER — NITROGLYCERIN 1 MG/10 ML FOR IR/CATH LAB
INTRA_ARTERIAL | Status: DC | PRN
  Administered 2017-07-18 (×4): 200 ug via INTRACORONARY

## 2017-07-18 MED ORDER — HEPARIN SODIUM (PORCINE) 1000 UNIT/ML IJ SOLN
INTRAMUSCULAR | Status: AC
  Filled 2017-07-18: qty 1

## 2017-07-18 MED ORDER — MORPHINE SULFATE (PF) 2 MG/ML IV SOLN
2.0000 mg | INTRAVENOUS | Status: DC | PRN
  Administered 2017-07-18: 2 mg via INTRAVENOUS

## 2017-07-18 MED ORDER — ORAL CARE MOUTH RINSE
15.0000 mL | Freq: Two times a day (BID) | OROMUCOSAL | Status: DC
  Administered 2017-07-18 – 2017-07-19 (×2): 15 mL via OROMUCOSAL

## 2017-07-18 MED ORDER — NOREPINEPHRINE BITARTRATE 1 MG/ML IV SOLN
INTRAVENOUS | Status: AC
  Filled 2017-07-18: qty 4

## 2017-07-18 MED ORDER — METOPROLOL TARTRATE 12.5 MG HALF TABLET
12.5000 mg | ORAL_TABLET | Freq: Two times a day (BID) | ORAL | Status: DC
  Administered 2017-07-18 – 2017-07-20 (×4): 12.5 mg via ORAL
  Filled 2017-07-18 (×4): qty 1

## 2017-07-18 MED ORDER — SODIUM CHLORIDE 0.9 % IV SOLN
250.0000 mL | INTRAVENOUS | Status: DC | PRN
  Administered 2017-07-18: 18:00:00 via INTRAVENOUS

## 2017-07-18 MED ORDER — SODIUM CHLORIDE 0.9 % WEIGHT BASED INFUSION
3.0000 mL/kg/h | INTRAVENOUS | Status: DC
  Administered 2017-07-18: 3 mL/kg/h via INTRAVENOUS

## 2017-07-18 MED ORDER — SODIUM CHLORIDE 0.9 % WEIGHT BASED INFUSION: 1.0000 mL/kg/h | INTRAVENOUS | Status: DC

## 2017-07-18 MED ORDER — VERAPAMIL HCL 2.5 MG/ML IV SOLN
INTRAVENOUS | Status: AC
  Filled 2017-07-18: qty 2

## 2017-07-18 MED ORDER — IOPAMIDOL (ISOVUE-300) INJECTION 61%
INTRAVENOUS | Status: DC | PRN
  Administered 2017-07-18: 315 mL via INTRA_ARTERIAL

## 2017-07-18 MED ORDER — HEPARIN SODIUM (PORCINE) 1000 UNIT/ML IJ SOLN
INTRAMUSCULAR | Status: DC | PRN
  Administered 2017-07-18: 4000 [IU] via INTRAVENOUS
  Administered 2017-07-18: 5000 [IU] via INTRAVENOUS

## 2017-07-18 MED ORDER — MIDAZOLAM HCL 2 MG/2ML IJ SOLN
INTRAMUSCULAR | Status: DC | PRN
  Administered 2017-07-18 (×2): 1 mg via INTRAVENOUS

## 2017-07-18 MED ORDER — SODIUM CHLORIDE 0.9 % IV SOLN: 250.0000 mL | INTRAVENOUS | Status: DC | PRN

## 2017-07-18 MED ORDER — MORPHINE SULFATE (PF) 2 MG/ML IV SOLN
2.0000 mg | INTRAVENOUS | Status: DC | PRN
  Administered 2017-07-18 (×2): 2 mg via INTRAVENOUS
  Filled 2017-07-18 (×2): qty 1

## 2017-07-18 MED ORDER — NITROGLYCERIN 5 MG/ML IV SOLN
INTRAVENOUS | Status: AC
  Filled 2017-07-18: qty 10

## 2017-07-18 MED ORDER — SODIUM CHLORIDE 0.9% FLUSH
3.0000 mL | Freq: Two times a day (BID) | INTRAVENOUS | Status: DC
  Administered 2017-07-18 – 2017-07-21 (×6): 3 mL via INTRAVENOUS

## 2017-07-18 MED ORDER — NOREPINEPHRINE BITARTRATE 1 MG/ML IV SOLN
0.0000 ug/min | INTRAVENOUS | Status: DC
  Filled 2017-07-18: qty 4

## 2017-07-18 MED ORDER — ATROPINE SULFATE 1 MG/10ML IJ SOSY
PREFILLED_SYRINGE | INTRAMUSCULAR | Status: AC
  Filled 2017-07-18: qty 10

## 2017-07-18 MED ORDER — HYDROMORPHONE HCL 1 MG/ML IJ SOLN
0.5000 mg | INTRAMUSCULAR | Status: DC | PRN
  Administered 2017-07-18 – 2017-07-19 (×6): 0.5 mg via INTRAVENOUS
  Filled 2017-07-18 (×6): qty 1

## 2017-07-18 MED ORDER — NITROGLYCERIN 0.4 MG SL SUBL: 0.4000 mg | SUBLINGUAL_TABLET | SUBLINGUAL | Status: DC | PRN

## 2017-07-18 MED ORDER — MIDAZOLAM HCL 2 MG/2ML IJ SOLN
INTRAMUSCULAR | Status: AC
  Filled 2017-07-18: qty 2

## 2017-07-18 MED ORDER — HEPARIN (PORCINE) IN NACL 2-0.9 UNIT/ML-% IJ SOLN
INTRAMUSCULAR | Status: AC
  Filled 2017-07-18: qty 500

## 2017-07-18 MED ORDER — SODIUM CHLORIDE 0.9 % IV SOLN
INTRAVENOUS | Status: AC | PRN
  Administered 2017-07-18: 250 mL/h via INTRAVENOUS

## 2017-07-18 MED ORDER — VERAPAMIL HCL 2.5 MG/ML IV SOLN
INTRAVENOUS | Status: DC | PRN
  Administered 2017-07-18: 2.5 mg via INTRAVENOUS

## 2017-07-18 MED ORDER — FENTANYL CITRATE (PF) 100 MCG/2ML IJ SOLN
INTRAMUSCULAR | Status: AC
Start: 2017-07-18 — End: 2017-07-18
  Filled 2017-07-18: qty 2

## 2017-07-18 MED ORDER — SODIUM CHLORIDE 0.9% FLUSH: 3.0000 mL | Freq: Two times a day (BID) | INTRAVENOUS | Status: DC

## 2017-07-18 MED ORDER — PROTAMINE SULFATE 10 MG/ML IV SOLN
INTRAVENOUS | Status: DC | PRN
  Administered 2017-07-18 (×2): 30 mg via INTRAVENOUS

## 2017-07-18 SURGICAL SUPPLY — 18 items
BALLN TREK RX 2.25X12 (BALLOONS) ×3
BALLN TREK RX 2.5X15 (BALLOONS) ×3
BALLOON TREK RX 2.25X12 (BALLOONS) ×1 IMPLANT
BALLOON TREK RX 2.5X15 (BALLOONS) ×1 IMPLANT
CATH LAUNCHER 6FR EBU3.5 (CATHETERS) ×3 IMPLANT
CATH OPTITORQUE JACKY 4.0 5F (CATHETERS) ×3 IMPLANT
DEVICE INFLAT 30 PLUS (MISCELLANEOUS) ×3 IMPLANT
DEVICE RAD TR BAND REGULAR (VASCULAR PRODUCTS) ×3 IMPLANT
GLIDESHEATH SLEND SS 6F .021 (SHEATH) ×3 IMPLANT
GUIDEWIRE INQWIRE 1.5J.035X260 (WIRE) ×1 IMPLANT
INQWIRE 1.5J .035X260CM (WIRE) ×3
KIT MANI 3VAL PERCEP (MISCELLANEOUS) ×3 IMPLANT
NEEDLE PERC 18GX7CM (NEEDLE) IMPLANT
PACK CARDIAC CATH (CUSTOM PROCEDURE TRAY) ×3 IMPLANT
SHEATH AVANTI 6FR X 11CM (SHEATH) IMPLANT
STENT RESOLUTE ONYX 2.25X18 (Permanent Stent) ×3 IMPLANT
WIRE GUIDERIGHT .035X150 (WIRE) IMPLANT
WIRE RUNTHROUGH .014X180CM (WIRE) ×6 IMPLANT

## 2017-07-18 NOTE — Progress Notes (Signed)
*  PRELIMINARY RESULTS* Echocardiogram 2D Echocardiogram has been performed.  Jeremiah [name] 07/18/2017, 10:45 AM

## 2017-07-18 NOTE — Plan of Care (Signed)
Patient is progressing. No complaints of chest pain. Patient is scheduled for cardiac catherization.

## 2017-07-18 NOTE — Progress Notes (Addendum)
Interventional Cardiology   Reviewed the digital images. 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 Prolonged balloon inflation after reversal of heparin was encouraging with what appears to be thrombosis of septal antegrade. May still have PDA source of bleeding from patent septal perforators.  BP 115/59 mmHg HR 57 bpm  Neck veins flat, chest clear, and no rub heard.  Monitor for arrhythmia (although not involving proximal septum) and diastolic heart failure due to hematoma.  Impression: Ongoing ischemic symptoms without significant ECG abnormality.  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  No anticoagulation.  Monitor course. Stat echo and ECG.  Morphine for pain control.  Discussed plan with patient and entire family.  Discussed the possibility of acute complications such as ventricular tach arrhythmias or bradycardia, hematoma dissection with free wall involvement and tamponade, and possible septal rupture with VSD.  We will monitor closely.  Greater than 40 minutes spent in clinical evaluation, review of coronary angiography, directing care,and discussion anticipated outcome.

## 2017-07-18 NOTE — Progress Notes (Signed)
  Echocardiogram 2D Echocardiogram has been performed.  Ayan Yankey G Rosalia Mcavoy 07/18/2017, 1:35 PM

## 2017-07-18 NOTE — Interval H&P Note (Signed)
Cath Lab Visit (complete for each Cath Lab visit)  Clinical Evaluation Leading to the Procedure:   ACS: Yes.   (NSTEMI)     History and Physical Interval Note:  07/18/2017 8:06 AM  Jeremiah [name]  has presented today for surgery, with the diagnosis of NSTEMI  The various methods of treatment have been discussed with the patient and family. After consideration of risks, benefits and other options for treatment, the patient has consented to  Procedure(s): LEFT HEART CATH AND CORONARY ANGIOGRAPHY (N/A) as a surgical intervention .  The patient's history has been reviewed, patient examined, no change in status, stable for surgery.  I have reviewed the patient's chart and labs.  Questions were answered to the patient's satisfaction.     Kathlyn [place]

## 2017-07-18 NOTE — Discharge Summary (Signed)
[facility] at [place] NAME: Jeremiah [name]    MR#:  [id number]  DATE OF BIRTH:  [birth year]  DATE OF ADMISSION:  07/15/2017   ADMITTING PHYSICIAN: Demetrios Loll, MD  DATE OF DISCHARGE: 07/18/2017 10:48 AM  PRIMARY CARE PHYSICIAN: Patient, No Pcp Per   ADMISSION DIAGNOSIS:  Angina at rest ([place]) [I20.8] Elevated troponin I level [R74.8] DISCHARGE DIAGNOSIS:  Active Problems:   NSTEMI (non-ST elevated myocardial infarction) ([place])  SECONDARY DIAGNOSIS:   Past Medical History:  Diagnosis Date  . Chest pain    a. 10/2014 Neg MV @ [facility].  . Colon polyps   . Hydrocele    a. 10/2013 s/p L epididymectomy and hyrocele repair.  Marland Kitchen Hyperlipidemia   . Hypertension   . Leukopenia   . Sinus bradycardia    HOSPITAL COURSE:  NSTEMI. Continue heparin drip, aspirin 81 mg daily and Lipitor 80 mg at bedtime.  Cardiac cath today: Severe one-vessel coronary artery disease with occluded mid LAD with collaterals from the right coronary artery.  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 Dr. Fletcher Anon recommended transfer to [facility] for further treatment.  Bradycardia.  Still bradycardic,Continue telemetry monitor, no beta-blocker at this time.  Hypertension. Blood pressure is controlled without hypertension medication.  Hyperlipidemia. Started Lipitor.  Discontinued the Zocor. Obesity.  DISCHARGE CONDITIONS:  The patient was transferred to [facility]. CONSULTS OBTAINED:  Treatment Team:  Wellington Hampshire, MD DRUG ALLERGIES:  No Known Allergies DISCHARGE MEDICATIONS:   Allergies as of 07/18/2017   No Known Allergies     Medication List    ASK your doctor about these medications   aspirin EC 81 MG tablet Take 81 mg by mouth  daily.   cetirizine 10 MG tablet Commonly known as:  ZYRTEC Take 10 mg by mouth daily.   cholecalciferol 1000 units tablet Commonly known as:  VITAMIN D Take 1,000 Units by mouth daily.   Fish Oil 1000 MG Caps Take 1,000 mg by mouth daily.   simvastatin 20 MG tablet Commonly known as:  ZOCOR Take 20 mg by mouth daily.   vitamin B-12 1000 MCG tablet Commonly known as:  CYANOCOBALAMIN Take 1,000 mcg by mouth daily.        DISCHARGE INSTRUCTIONS:  See AVS.  If you experience worsening of your admission symptoms, develop shortness of breath, life threatening emergency, suicidal or homicidal thoughts you must seek medical attention immediately by calling 911 or calling your MD immediately  if symptoms less severe.  You Must read complete instructions/literature along with all the possible adverse reactions/side effects for all the Medicines you take and that have been prescribed to you. Take any new Medicines after you have completely understood and accpet all the possible adverse reactions/side effects.   Please note  You were cared for by a hospitalist during your hospital stay. If you have any questions about your discharge medications or the care you received while you were in the hospital after you are discharged, you can call the unit and asked to speak with the hospitalist on call if the hospitalist that took care of you is not available. Once you are discharged, your primary care physician will handle any further medical issues. Please note that NO REFILLS for any discharge medications will be  authorized once you are discharged, as it is imperative that you return to your primary care physician (or establish a relationship with a primary care physician if you do not have one) for your aftercare needs so that they can reassess your need for medications and monitor your lab values.    On the day of Discharge:  VITAL SIGNS:  Blood pressure 117/75, pulse (!) 56, temperature  97.7 F (36.5 C), temperature source Oral, resp. rate 18, height 5\' 9"  (1.753 m), weight 209 lb (94.8 kg), SpO2 100 %. PHYSICAL EXAMINATION:  GENERAL:  72 y.o.-year-old patient lying in the bed with no acute distress.  EYES: Pupils equal, round, reactive to light and accommodation. No scleral icterus. Extraocular muscles intact.  HEENT: Head atraumatic, normocephalic. Oropharynx and nasopharynx clear.  NECK:  Supple, no jugular venous distention. No thyroid enlargement, no tenderness.  LUNGS: Normal breath sounds bilaterally, no wheezing, rales,rhonchi or crepitation. No use of accessory muscles of respiration.  CARDIOVASCULAR: S1, S2 normal. No murmurs, rubs, or gallops.  ABDOMEN: Soft, non-tender, non-distended. Bowel sounds present. No organomegaly or mass.  EXTREMITIES: No pedal edema, cyanosis, or clubbing.  NEUROLOGIC: Cranial nerves II through XII are intact. Muscle strength 5/5 in all extremities. Sensation intact. Gait not checked.  PSYCHIATRIC: The patient is alert and oriented x 3.  SKIN: No obvious rash, lesion, or ulcer.  DATA REVIEW:   CBC Recent Labs  Lab 07/18/17 0332  WBC 5.6  HGB 15.4  HCT 45.4  PLT 265    Chemistries  Recent Labs  Lab 07/15/17 0952 07/18/17 1430  NA 139 138  K 4.5 3.9  CL 106 107  CO2 24 21*  GLUCOSE 109* 122*  BUN 20 16  CREATININE 1.02 1.06  CALCIUM 9.7 8.8*  AST 20  --   ALT 19  --   ALKPHOS 45  --   BILITOT 0.7  --      Microbiology Results  No results found for this or any previous visit.  RADIOLOGY:  No results found.   Management plans discussed with the patient, family and they are in agreement.  CODE STATUS: Full Code   TOTAL TIME TAKING CARE OF THIS PATIENT: 33 minutes.    Demetrios Loll M.D on 07/18/2017 at 7:34 PM  Between 7am to 6pm - Pager - [phone]  After 6pm go to [email or web address] - Proofreader  [facility]  Office  [phone]  CC: Primary care physician; Patient,  No Pcp Per   Note: This dictation was prepared with Dragon dictation along with smaller phrase technology. Any transcriptional errors that result from this process are unintentional.

## 2017-07-18 NOTE — Progress Notes (Signed)
   Still having discomfort.  Adjustments made in opiate therapy, switching from morphine to Dilaudid.  Echo was reviewed.  Focal wall motion abnormality without evidence of septal enlargement/hematoma.  Continue conservative management.  Aspirin will be continued.

## 2017-07-18 NOTE — Progress Notes (Signed)
ANTICOAGULATION CONSULT NOTE  Pharmacy Consult for Heparin Drip Indication: chest pain/ACS  Not on File  Patient Measurements: Height: 5\' 9"  (175.3 cm) Weight: 209 lb 9.6 oz (95.1 kg) IBW/kg (Calculated) : 70.7 Heparin Dosing Weight: 92 kg  Vital Signs: Temp: 98.6 F (37 C) (03/25 0351) Temp Source: Oral (03/25 0351) BP: 115/59 (03/25 0351) Pulse Rate: 55 (03/25 0351)  Labs: Recent Labs    07/15/17 0952 07/15/17 1436 07/15/17 1530 07/15/17 1901 07/15/17 2358  07/16/17 1959 07/17/17 0426 07/18/17 0332  HGB 14.9  --   --   --  15.4  --   --  15.3 15.4  HCT 44.3  --   --   --  44.9  --   --  44.6 45.4  PLT 262  --   --   --  268  --   --  250 265  APTT  --   --  28  --   --   --   --   --   --   LABPROT  --   --  13.2  --   --   --   --   --   --   INR  --   --  1.01  --   --   --   --   --   --   HEPARINUNFRC  --   --   --   --  0.29*   < > 0.47 0.40 0.36  CREATININE 1.02  --   --   --   --   --   --   --   --   TROPONINI <0.03 0.14*  --  0.11* 0.10*  --   --   --   --    < > = values in this interval not displayed.    Estimated Creatinine Clearance: 73.4 mL/min (by C-G formula based on SCr of 1.02 mg/dL).   Medical History: Past Medical History:  Diagnosis Date  . Chest pain    a. 10/2014 Neg MV @ [facility].  . Colon polyps   . Hydrocele    a. 10/2013 s/p L epididymectomy and hyrocele repair.  Marland Kitchen Hyperlipidemia   . Hypertension   . Leukopenia   . Sinus bradycardia     Assessment: 72 y/o M admitted with CP not on anticoagulant PTA.   Goal of Therapy:  Heparin level 0.3-0.7 units/ml Monitor platelets by anticoagulation protocol: Yes   Plan:  Give 4000 units bolus x 1 Start heparin infusion at 1250 units/hr Check anti-Xa level in 8 hours and daily while on heparin Continue to monitor H&H and platelets   03/23 0000 heparin level 0.29. 1400 unit bolus and increase rate to 1450 units/hr. Recheck in 8 hours.   3/23  12:12  HL = 0.45.  Continue current drip  rate.  Recheck HL tonight at 20:00.  3/23 19:59 HL therapeutic x 2. Continue current rate. Will recheck HL/CBC with AM labs.  3/24 AM heparin level 0.40. Continue current regimen. Recheck heparin level and CBC with tomorrow AM labs.  3/25 AM heparin level 0.36. Continue current regimen. Recheck heparin level and CBC with tomorrow AM labs.   Hazleigh Mccleave S, RPH 07/18/2017,5:15 AM

## 2017-07-19 ENCOUNTER — Inpatient Hospital Stay (HOSPITAL_COMMUNITY): Payer: Medicare HMO

## 2017-07-19 DIAGNOSIS — I214 Non-ST elevation (NSTEMI) myocardial infarction: Principal | ICD-10-CM

## 2017-07-19 DIAGNOSIS — I9788 Other intraoperative complications of the circulatory system, not elsewhere classified: Secondary | ICD-10-CM

## 2017-07-19 DIAGNOSIS — I313 Pericardial effusion (noninflammatory): Secondary | ICD-10-CM

## 2017-07-19 LAB — CBC
HCT: 42.4 % (ref 39.0–52.0)
Hemoglobin: 14.5 g/dL (ref 13.0–17.0)
MCH: 32.3 pg (ref 26.0–34.0)
MCHC: 34.2 g/dL (ref 30.0–36.0)
MCV: 94.4 fL (ref 78.0–100.0)
PLATELETS: 246 10*3/uL (ref 150–400)
RBC: 4.49 MIL/uL (ref 4.22–5.81)
RDW: 12.9 % (ref 11.5–15.5)
WBC: 9.5 10*3/uL (ref 4.0–10.5)

## 2017-07-19 LAB — BASIC METABOLIC PANEL
Anion gap: 9 (ref 5–15)
BUN: 15 mg/dL (ref 6–20)
CHLORIDE: 102 mmol/L (ref 101–111)
CO2: 25 mmol/L (ref 22–32)
Calcium: 8.8 mg/dL — ABNORMAL LOW (ref 8.9–10.3)
Creatinine, Ser: 0.98 mg/dL (ref 0.61–1.24)
GFR calc non Af Amer: >60 mL/min (ref >60)
Glucose, Bld: 116 mg/dL — ABNORMAL HIGH (ref 65–99)
Potassium: 4.2 mmol/L (ref 3.5–5.1)
SODIUM: 136 mmol/L (ref 135–145)

## 2017-07-19 LAB — ECHOCARDIOGRAM LIMITED
Height: 69 in
Weight: 3360 oz

## 2017-07-19 LAB — LIPID PANEL
CHOLESTEROL: 159 mg/dL (ref 0–200)
HDL: 45 mg/dL (ref >40)
LDL Cholesterol: 92 mg/dL (ref 0–99)
TRIGLYCERIDES: 111 mg/dL (ref <150)
Total CHOL/HDL Ratio: 3.5 RATIO
VLDL: 22 mg/dL (ref 0–40)

## 2017-07-19 LAB — TROPONIN I
TROPONIN I: 9.91 ng/mL — AB (ref <0.03)
Troponin I: 9.47 ng/mL (ref <0.03)

## 2017-07-19 NOTE — H&P (Signed)
Updated history and physical, late entry for admission 07/18/2017.  Please refer to the critical care note of 07/18/2017 at 11:52 AM.   The patient was admitted to [facility] hospital on 07/15/2017 and underwent cardiac catheterization on 07/18/2017.  The procedure was complicated by coronary perforation after stent implantation.  The patient was transferred here for management in case surgical therapy became necessary.  At transfer he had ongoing chest pain without significant ST-T abnormality.  On examination the patient was stable.  Neck veins were not distended.  Blood pressure was greater than 998 mmHg systolic.  Exam did not reveal a pericardial friction rub.  Lungs were clear.  Radial cath site was unremarkable.  Overall impression after review of data including digital images was that conservative management would be the treatment option of choice.  For more detailed historical features please see the copied note below:  CHIEF COMPLAINT:       Chief Complaint  Patient presents with  . Chest Pain    Chest pain today. HISTORY OF PRESENT ILLNESS:  Jeremiah [name]  is a 72 y.o. male with a known history of hypertension hyperlipidemia.  The patient started to have chest heaviness without radiation this morning.  He took Tums without improvement.  In addition, he complains of nausea, but no nausea or diaphoresis.  He denies any other symptoms.  He said he had stress from family recently.  His troponin is elevated at 0.14.  Chest x-ray and EKG are unremarkable.   PAST MEDICAL HISTORY:        Past Medical History:  Diagnosis Date  . Hyperlipidemia    . Hypertension        PAST SURGICAL HISTORY:  EPIDIDYMECTOMY UNILATERAL SOCIAL HISTORY:    Social History        Tobacco Use  . Smoking status: Never Smoker  . Smokeless tobacco: Never Used  Substance Use Topics  . Alcohol use: Not on file      FAMILY HISTORY:         Family History  Problem Relation Age of Onset  .  Cancer Father    . Heart attack Father    . Colon cancer Father        DRUG ALLERGIES:  Not on File   REVIEW OF SYSTEMS:    Review of Systems  Constitutional: Negative for chills, fever and malaise/fatigue.  HENT: Negative for sore throat.   Eyes: Negative for blurred vision and double vision.  Respiratory: Negative for cough, hemoptysis, shortness of breath, wheezing and stridor.   Cardiovascular: Positive for chest pain. Negative for palpitations, orthopnea and leg swelling.  Gastrointestinal: Positive for nausea. Negative for abdominal pain, blood in stool, diarrhea, melena and vomiting.  Genitourinary: Negative for dysuria, flank pain and hematuria.  Musculoskeletal: Negative for back pain and joint pain.  Skin: Negative for rash.  Neurological: Negative for dizziness, sensory change, focal weakness, seizures, loss of consciousness, weakness and headaches.  Endo/Heme/Allergies: Negative for polydipsia.  Psychiatric/Behavioral: Negative for depression. The patient is not nervous/anxious.       MEDICATIONS AT HOME:           Prior to Admission medications   Medication Sig Start Date End Date Taking? Authorizing Provider  aspirin EC 81 MG tablet Take 81 mg by mouth daily.     Yes [provider]  cetirizine (ZYRTEC) 10 MG tablet Take 10 mg by mouth daily.     Yes [provider]  cholecalciferol (VITAMIN D) 1000 units tablet  Take 1,000 Units by mouth daily.     Yes [provider]  Omega-3 Fatty Acids (FISH OIL) 1000 MG CAPS Take 1 capsule by mouth daily.     Yes [provider]  simvastatin (ZOCOR) 20 MG tablet Take 20 mg by mouth daily.     Yes [provider]  vitamin B-12 (CYANOCOBALAMIN) 1000 MCG tablet Take 1,000 mcg by mouth daily.     Yes [provider]        VITAL SIGNS:  Blood pressure 132/75, pulse (!) 55, temperature 97.9 F (36.6 C), temperature source Oral, resp. rate 17, height 5\' 9"  (1.753 m), weight 222  lb 14.4 oz (101.1 kg), SpO2 99 %.   PHYSICAL EXAMINATION:  Physical Exam   GENERAL:  72 y.o.-year-old patient lying in the bed with no acute distress.  Obesity. EYES: Pupils equal, round, reactive to light and accommodation. No scleral icterus. Extraocular muscles intact.  HEENT: Head atraumatic, normocephalic. Oropharynx and nasopharynx clear.  NECK:  Supple, no jugular venous distention. No thyroid enlargement, no tenderness.  LUNGS: Normal breath sounds bilaterally, no wheezing, rales,rhonchi or crepitation. No use of accessory muscles of respiration.  CARDIOVASCULAR: S1, S2 normal. No murmurs, rubs, or gallops.  ABDOMEN: Soft, nontender, nondistended. Bowel sounds present. No organomegaly or mass.  EXTREMITIES: No pedal edema, cyanosis, or clubbing.  NEUROLOGIC: Cranial nerves II through XII are intact. Muscle strength 5/5 in all extremities. Sensation intact. Gait not checked.  PSYCHIATRIC: The patient is alert and oriented x 3.  SKIN: No obvious rash, lesion, or ulcer.    LABORATORY PANEL:    CBC Last Labs      Recent Labs  Lab 07/15/17 0952  WBC 4.5  HGB 14.9  HCT 44.3  PLT 262      ------------------------------------------------------------------------------------------------------------------   Chemistries  Last Labs      Recent Labs  Lab 07/15/17 0952  NA 139  K 4.5  CL 106  CO2 24  GLUCOSE 109*  BUN 20  CREATININE 1.02  CALCIUM 9.7  AST 20  ALT 19  ALKPHOS 45  BILITOT 0.7      ------------------------------------------------------------------------------------------------------------------   Cardiac Enzymes Last Labs   Recent Labs  Lab 07/15/17 1436  TROPONINI 0.14*      ------------------------------------------------------------------------------------------------------------------   RADIOLOGY:   Imaging Results (Last 48 hours)  Dg Chest 2 View   Result Date: 07/15/2017 CLINICAL DATA:  Chest pain. EXAM: CHEST - 2 VIEW COMPARISON:   None. FINDINGS: The heart size and mediastinal contours are within normal limits. Normal pulmonary vascularity. Atherosclerotic calcification of the aortic arch. No focal consolidation, pleural effusion, or pneumothorax. No acute osseous abnormality. IMPRESSION: 1.  No active cardiopulmonary disease. 2.  Aortic atherosclerosis (ICD10-I70.0). Electronically Signed   By: Titus Dubin M.D.   On: 07/15/2017 10:52           IMPRESSION AND PLAN:    NSTEMI. The patient will be admitted to medical floor with telemetry monitor. Start heparin drip, give aspirin 324 mg 1 dose and then 81 mg daily from tomorrow.  Hold Zocor and start Lipitor 40 mg at bedtime.  Follow-up troponin level and cardiology consult.   Bradycardia.  Continue telemetry monitor, no beta-blocker at this time. Hypertension.  Blood pressure is controlled without hypertension medication. Hyperlipidemia.  Start Lipitor. Obesity. I discussed with Dr. Fletcher Anon. All the records are reviewed and case discussed with ED provider. Management plans discussed with the patient, his wife and they are in agreement.   CODE  STATUS: Full code   TOTAL TIME TAKING CARE OF THIS PATIENT: 56 minutes.      Demetrios Loll M.D on 07/15/2017 at 4:16 PM

## 2017-07-19 NOTE — Progress Notes (Signed)
Progress Note  Please see the complete note by Dr. Berline Lopes.  Increase activity today.  Repeat echocardiogram in a.m.  Long discussion with patient and wife concerning management.  Current plan is conservative medical therapy as the LAD is too small to intervene upon either surgically or with PCI.  Clinically we feel the perforation in the septal perforator has resolved.  Phase 1 cardiac rehab watching vital signs closely and progressing from there.  Cycle cardiac markers.

## 2017-07-19 NOTE — Progress Notes (Signed)
CRITICAL VALUE ALERT  Critical Value:  Troponin (9.91)  Date & Time Notied:  07/19/2017 1330  Provider Notified: NP Mancel Bale  Orders Received/Actions taken: Spoke with NP Mancel Bale. No new orders. Will continue to monitor patient.

## 2017-07-19 NOTE — Progress Notes (Addendum)
The patient has been seen in conjunction with Kathi Ludwig, MD. All aspects of care have been considered and discussed. The patient has been personally interviewed, examined, and all clinical data has been reviewed.   Please see my earlier notes.  Progress Note  Patient Name: Jeremiah [name] Date of Encounter: 07/19/2017  Primary Cardiologist: Kathlyn Sacramento, MD   Subjective   The patient was lying in his bed today when entering the room.  He denies chest pain currently but stated that he had an event overnight chest pain reached near admission levels while attempting to ambulate to the restroom.  It resolved following his dose of Dilaudid and he continues to be pain-free with Dilaudid.  He was informed of the current management plan is for conservative medical management.  Inpatient Medications    Scheduled Meds: . aspirin EC  81 mg Oral Daily  . atorvastatin  80 mg Oral q1800  . mouth rinse  15 mL Mouth Rinse BID  . metoprolol tartrate  12.5 mg Oral BID  . sodium chloride flush  3 mL Intravenous Q12H   Continuous Infusions: . sodium chloride Stopped (07/19/17 0500)   PRN Meds: sodium chloride, acetaminophen, HYDROmorphone (DILAUDID) injection, morphine injection, nitroGLYCERIN, ondansetron (ZOFRAN) IV, sodium chloride flush   Vital Signs    Vitals:   07/19/17 0400 07/19/17 0500 07/19/17 0600 07/19/17 0700  BP: 116/68 134/67 117/68 139/64  Pulse: (!) 52 (!) 53 64 64  Resp: 17 15 (!) 27 16  Temp:      TempSrc:      SpO2: 98% 99% 100% 98%  Weight:      Height:        Intake/Output Summary (Last 24 hours) at 07/19/2017 0854 Last data filed at 07/19/2017 0600 Gross per 24 hour  Intake 1654 ml  Output 775 ml  Net 879 ml   Filed Weights   07/18/17 1239  Weight: 95.3 kg (210 lb)    Telemetry    NSR with multiform PVCs- Personally Reviewed  ECG    NSR with Q wave in lead III- Personally Reviewed  Physical Exam   GEN: No acute distress.  Resting  comfortably in his bed alert and oriented x3 Neck: No JVD Cardiac: RRR, no murmurs, rubs, or gallops.  Respiratory: Clear to auscultation bilaterally. GI: Soft, nontender, non-distended  MS: No edema; No deformity. Neuro:  Nonfocal  Psych: Normal affect   Labs    Chemistry Recent Labs  Lab 07/15/17 0952 07/18/17 1430 07/19/17 0330  NA 139 138 136  K 4.5 3.9 4.2  CL 106 107 102  CO2 24 21* 25  GLUCOSE 109* 122* 116*  BUN 20 16 15   CREATININE 1.02 1.06 0.98  CALCIUM 9.7 8.8* 8.8*  PROT 6.7  --   --   ALBUMIN 3.7  --   --   AST 20  --   --   ALT 19  --   --   ALKPHOS 45  --   --   BILITOT 0.7  --   --   GFRNONAA >60 >60 >60  GFRAA >60 >60 >60  ANIONGAP 9 10 9      Hematology Recent Labs  Lab 07/17/17 0426 07/18/17 0332 07/19/17 0330  WBC 5.2 5.6 9.5  RBC 4.65 4.76 4.49  HGB 15.3 15.4 14.5  HCT 44.6 45.4 42.4  MCV 95.9 95.2 94.4  MCH 32.8 32.3 32.3  MCHC 34.2 33.9 34.2  RDW 13.5 13.4 12.9  PLT 250 265 246  Cardiac Enzymes Recent Labs  Lab 07/15/17 0952 07/15/17 1436 07/15/17 1901 07/15/17 2358  TROPONINI <0.03 0.14* 0.11* 0.10*   No results for input(s): TROPIPOC in the last 168 hours.   BNPNo results for input(s): BNP, PROBNP in the last 168 hours.   DDimer No results for input(s): DDIMER in the last 168 hours.   Radiology    No results found.  Cardiac Studies   Echocardiogram 3/2 repeat echo the following day again failed to demonstrate more than trivial pericardial effusion 08/2017: Study Conclusions  - Left ventricle: The cavity size was normal. There was mild   concentric hypertrophy. Systolic function was at the lower limits   of normal. The estimated ejection fraction was in the range of   50% to 55%. Severe hypokinesis of the apicalanteroseptal   myocardium; consistent with ischemia in the distribution of the   left anterior descending coronary artery. Doppler parameters are   consistent with abnormal left ventricular relaxation  (grade 1   diastolic dysfunction). - Aortic valve: There was mild regurgitation. - Pericardium, extracardiac: A trivial pericardial effusion was   identified.  Echocardiogram 07/18/2017 post cath: Study Conclusions  - Left ventricle: The cavity size was normal. Systolic function was   normal. The estimated ejection fraction was in the range of 50%   to 55%. There is severe hypokinesis of the anteroseptal and   inferoseptal myocardium. - Pulmonary arteries: Systolic pressure could not be accurately   estimated. - Pericardium, extracardiac: There was no pericardial effusion.  Cardiac cath 07/18/2017:  Ost RCA to Prox RCA lesion is 30% stenosed.  Mid RCA lesion is 30% stenosed.  Prox LAD lesion is 60% stenosed.  Mid LAD lesion is 100% stenosed.   1.  Severe one-vessel coronary artery disease with occluded mid LAD with collaterals from the right coronary artery.  The LAD system appears to be small due to large first diagonal and right coronary artery.   2.  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  Echocardiogram showed no significant pericardial effusion.  Recommendations: The patient started having chest pain and EKG changes as soon as he was placed on the Cath Lab table before doing that diagnostic catheterization.  I suspect that he had acute on chronic occlusion of the mid LAD with some visible collaterals from the right coronary artery. Treat with aspirin and high-dose statin for now. Monitor the patient closely and repeat echocardiogram as soon as he arrives to [facility] to ensure no pericardial effusion. If he remains hemodynamically stable, most likely he will be treated medically. I discussed the case with Dr. Ellyn Hack and Dr. Saralyn Pilar.  I also gave a report to Dr. Tamala Julian who  accepted the patient.  Patient Profile     72 y.o. male past medical history notable for hypertension, HLD, leukopenia, sinus bradycardia who presented to the hospital with chest pain found to have an end STEMI.  He underwent cardiac catheterization on 07/18/2017 which was ultimately unsuccessful to 200% occlusion of the mid LAD.  There  there is a dissection into a septal branch which was treated with prolonged balloon inflation reversal of anticoagulation.  There are significant collaterals from the RCA distal to the affected branch.  Post catheter echo failed to demonstrate pericardial effusion.  Repeat echo the following day again failed to demonstrate with a  trivial pericardial effusion, conservative medical management was recommended.  Assessment & Plan    NSTEMI: Troponins mildly elevated on admission prompting cardiac catheterization.  This was however not amenable to PCI and ultimately resulted in dissection without evidence of pericardial effusion x2.  The patient continues to experience mild chest pain at rest without analgesic significant chest pain with ambulation.  Plan to continue this conservative medical management at this time. Plan to get the patient up and moving today as we progress his care and titrate his medications.  -Continue Dilaudid for pain PRN -Continue ASA -Continue metoprolol tartrate 12.5 mg twice daily --Continue NTG sublingual PRN for chest pain. --Repeating troponin trend until peak is determined --Consult for cardiac rehab placed Holding anticoagulation and antiplatelet medications until Echo completed in am  Hypertension: Blood pressures been stable from admission.   HLD: Lipid profile indicating HDL 45 and LDL 92.  Recommend  continuing on atorvastatin 80 mg daily with follow-up in 6-8 weeks with an outpatient clinic.   For questions or updates, please contact [place] Please consult [email or web address] for contact info under Cardiology/STEMI.     Please see attending note/attestation for current A/P.   Signed, Kathi Ludwig, MD  07/19/2017, 8:54 AM

## 2017-07-19 NOTE — Progress Notes (Signed)
  Echocardiogram 2D Echocardiogram has been performed.  Dannielle Baskins G Lexxus [place]hill 07/19/2017, 1:58 PM

## 2017-07-20 DIAGNOSIS — E785 Hyperlipidemia, unspecified: Secondary | ICD-10-CM

## 2017-07-20 LAB — TROPONIN I: TROPONIN I: 9.34 ng/mL — AB (ref <0.03)

## 2017-07-20 MED ORDER — METOPROLOL TARTRATE 12.5 MG HALF TABLET
12.5000 mg | ORAL_TABLET | Freq: Once | ORAL | Status: AC
  Administered 2017-07-20: 12.5 mg via ORAL
  Filled 2017-07-20: qty 1

## 2017-07-20 MED ORDER — METOPROLOL SUCCINATE ER 25 MG PO TB24
25.0000 mg | ORAL_TABLET | Freq: Every day | ORAL | Status: DC
  Administered 2017-07-21: 25 mg via ORAL
  Filled 2017-07-20: qty 1

## 2017-07-20 NOTE — Progress Notes (Addendum)
The patient has been seen in conjunction with Kathi Ludwig, MD. All aspects of care have been considered and discussed. The patient has been personally interviewed, examined, and all clinical data has been reviewed.   Agree with contents of this note.  Change metoprolol tartrate to succinate.  Progress Note  Patient Name: Jeremiah [name] Date of Encounter: 07/20/2017  Primary Cardiologist: Kathlyn Sacramento, MD   Subjective   The patient was resting comfortably in his bed today upon entering the room. He denied chest pain, nausea, vomiting, diaphoresis, dyspnea, or other concerning symptoms with activity or at rest. His last dose of pain medication was later yesterday afternoon as per patient.   Inpatient Medications    Scheduled Meds: . aspirin EC  81 mg Oral Daily  . atorvastatin  80 mg Oral q1800  . metoprolol tartrate  12.5 mg Oral BID  . sodium chloride flush  3 mL Intravenous Q12H   Continuous Infusions: . sodium chloride Stopped (07/19/17 0500)   PRN Meds: sodium chloride, acetaminophen, HYDROmorphone (DILAUDID) injection, morphine injection, nitroGLYCERIN, ondansetron (ZOFRAN) IV, sodium chloride flush   Vital Signs    Vitals:   07/20/17 0600 07/20/17 0700 07/20/17 0800 07/20/17 0815  BP: (!) 103/50 108/66 130/64   Pulse: 60 (!) 59 72   Resp: 17 19 13    Temp:    97.9 F (36.6 C)  TempSrc:    Oral  SpO2: 97% 95% 96%   Weight:      Height:        Intake/Output Summary (Last 24 hours) at 07/20/2017 1116 Last data filed at 07/20/2017 0900 Gross per 24 hour  Intake 720 ml  Output 1126 ml  Net -406 ml   Filed Weights   07/18/17 1239  Weight: 95.3 kg (210 lb)    Telemetry    NSR - Personally Reviewed  ECG    No new study on file. - Personally Reviewed  Physical Exam   GEN: No acute distress.  Resting in bed, alert and oriented x3. Conversing with ease Neck: No JVD Cardiac: RRR, no murmurs, rubs, or gallops.  Respiratory: Clear to  auscultation bilaterally. GI: Soft, nontender, non-distended  MS: No edema; No deformity. Neuro:  Nonfocal  Psych: Normal affect   Labs    Chemistry Recent Labs  Lab 07/15/17 0952 07/18/17 1430 07/19/17 0330  NA 139 138 136  K 4.5 3.9 4.2  CL 106 107 102  CO2 24 21* 25  GLUCOSE 109* 122* 116*  BUN 20 16 15   CREATININE 1.02 1.06 0.98  CALCIUM 9.7 8.8* 8.8*  PROT 6.7  --   --   ALBUMIN 3.7  --   --   AST 20  --   --   ALT 19  --   --   ALKPHOS 45  --   --   BILITOT 0.7  --   --   GFRNONAA >60 >60 >60  GFRAA >60 >60 >60  ANIONGAP 9 10 9      Hematology Recent Labs  Lab 07/17/17 0426 07/18/17 0332 07/19/17 0330  WBC 5.2 5.6 9.5  RBC 4.65 4.76 4.49  HGB 15.3 15.4 14.5  HCT 44.6 45.4 42.4  MCV 95.9 95.2 94.4  MCH 32.8 32.3 32.3  MCHC 34.2 33.9 34.2  RDW 13.5 13.4 12.9  PLT 250 265 246    Cardiac Enzymes Recent Labs  Lab 07/15/17 2358 07/19/17 1202 07/19/17 1716 07/19/17 2352  TROPONINI 0.10* 9.91* 9.47* 9.34*   No results for input(s):  TROPIPOC in the last 168 hours.   BNPNo results for input(s): BNP, PROBNP in the last 168 hours.   DDimer No results for input(s): DDIMER in the last 168 hours.   Radiology    No results found.  Cardiac Studies   Echo 07/19/2017: Study Conclusions - Left ventricle: The cavity size was normal. There was mild focal   basal hypertrophy of the septum. Systolic function was normal.   The estimated ejection fraction was in the range of 55% to 60%.   There is akinesis of the midanteroseptal myocardium. Doppler   parameters are consistent with abnormal left ventricular   relaxation (grade 1 diastolic dysfunction). - Aortic valve: There was trivial regurgitation. Echo completed indicating an LVEF of 71-06% grade 1 diastolic dysfunction Impressions: - Akinesis of the mid septum with overall preserved LV systolic   function; mild diastolic dysfunction; trace AI; no pericardial   effusion.  Echocardiogram 3/2 repeat echo  the following day again failed to demonstrate more than trivial pericardial effusion 08/2017: Study Conclusions  - Left ventricle: The cavity size was normal. There was mild concentric hypertrophy. Systolic function was at the lower limits of normal. The estimated ejection fraction was in the range of 50% to 55%. Severe hypokinesis of the apicalanteroseptal myocardium; consistent with ischemia in the distribution of the left anterior descending coronary artery. Doppler parameters are consistent with abnormal left ventricular relaxation (grade 1 diastolic dysfunction). - Aortic valve: There was mild regurgitation. - Pericardium, extracardiac: A trivial pericardial effusion was identified.  Echocardiogram 07/18/2017 post cath: Study Conclusions  - Left ventricle: The cavity size was normal. Systolic function was normal. The estimated ejection fraction was in the range of 50% to 55%. There is severe hypokinesis of the anteroseptal and inferoseptal myocardium. - Pulmonary arteries: Systolic pressure could not be accurately estimated. - Pericardium, extracardiac: There was no pericardial effusion.  Cardiac cath 07/18/2017:  Ost RCA to Prox RCA lesion is 30% stenosed.  Mid RCA lesion is 30% stenosed.  Prox LAD lesion is 60% stenosed.  Mid LAD lesion is 100% stenosed.  1. Severe one-vessel coronary artery disease with occluded mid LAD with collaterals from the right coronary artery. The LAD system appears to be small due to large first diagonal and right coronary artery.  2. 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 Echocardiogram showed no significant pericardial effusion.  Recommendations: The patient started having chest pain and EKG changes as soon  as he was placed on the Cath Lab table before doing that diagnostic catheterization. I suspect that he had acute on chronic occlusion of the mid LAD with some visible collaterals from the right coronary artery. Treat with aspirin and high-dose statin for now. Monitor the patient closely and repeat echocardiogram as soon as he arrives to [facility] to ensure no pericardial effusion. If he remains hemodynamically stable, most likely he will be treated medically. I discussed the case with Dr. Ellyn Hack and Dr. Saralyn Pilar. I also gave a report to Dr. Tamala Julian who accepted the patient.  Patient Profile     72 y.o. male with a past medical history notable for hypertension, HLD, leukopenia, sinus bradycardia who presented to the hospital with chest pain found to have an end STEMI.  He underwent cardiac catheterization on 07/18/2017 with attempted PCI which was ultimately unsuccessful. There was a dissection  into a septal branch during attempted cannulization of the occluded mid LAD which was treated with prolonged balloon inflation reversal of anticoagulation.  There are significant collaterals from the RCA distal to the affected branch.  Post catheter echo failed to demonstrate pericardial effusion.  Repeat echo the following day again failed to demonstrate with a trivial pericardial effusion, conservative medical management was recommended. The patient continued to improve throughout his stay.  Assessment & Plan    NSTEMI: At approximately elevated on admission being 9.91 being to decrease slowly.  PCI was attempted but ultimately unsuccessful as resulted in minor dissection.  Echo repeated x2 without evidence of pericardial effusion.  Patient denies dyspnea or other signs of shock continues to improve.  We will repeat the echo prior to discharge and recommend medical management moving forward.  Given his presentation with a dissection we do not dual antiplatelet therapy.  Due to the patient's hypertension unable to  initiate ACE inhibitor therapy will increase-year-old with beta-blocker at this time.  We will continue to make adjustments as permitted. -Continue aspirin daily and on discharge -Not currently recommending dual antiplatelet therapy -Continue metoprolol tartrate but increase to 25 mg twice daily -Continue NTG 7 will as needed for chest pain -Continue cardiac rehab -Continue statin with atorvastatin 80 mg daily -Patient stable to transfer to stepdown bed -Continue phase 1 cardiac rehab.  Hypertension: Blood pressures been stable since admission.  We will continue to monitor  HLD: Lipid profile indicating HDL 45 LDL 92.  We may continue atorvastatin 80 mg daily follow-up in 6-8 weeks for repeat lipid profile.  For questions or updates, please contact [place] Please consult [email or web address] for contact info under Cardiology/STEMI.   Please see attending note/attestation for current assessment and plan.  Signed, Kathi Ludwig, MD  07/20/2017, 11:16 AM

## 2017-07-20 NOTE — Progress Notes (Signed)
Progress Note  Patient Name: Jeremiah [name] Date of Encounter: 07/20/2017  Primary Cardiologist: Kathlyn Sacramento, MD   Subjective   The patient feels well this morning.  He denies chest discomfort.  He has had no recurrence of the discomfort that occurred when he arose from bed 24 hours ago.  He was able to walk with rehab yesterday without angina.  Inpatient Medications    Scheduled Meds: . aspirin EC  81 mg Oral Daily  . atorvastatin  80 mg Oral q1800  . metoprolol tartrate  12.5 mg Oral BID  . sodium chloride flush  3 mL Intravenous Q12H   Continuous Infusions: . sodium chloride Stopped (07/19/17 0500)   PRN Meds: sodium chloride, acetaminophen, HYDROmorphone (DILAUDID) injection, morphine injection, nitroGLYCERIN, ondansetron (ZOFRAN) IV, sodium chloride flush   Vital Signs    Vitals:   07/20/17 0600 07/20/17 0700 07/20/17 0800 07/20/17 0815  BP: (!) 103/50 108/66 130/64   Pulse: 60 (!) 59 72   Resp: 17 19 13    Temp:    97.9 F (36.6 C)  TempSrc:    Oral  SpO2: 97% 95% 96%   Weight:      Height:        Intake/Output Summary (Last 24 hours) at 07/20/2017 1118 Last data filed at 07/20/2017 0900 Gross per 24 hour  Intake 720 ml  Output 1126 ml  Net -406 ml   Filed Weights   07/18/17 1239  Weight: 210 lb (95.3 kg)    Telemetry    Normal sinus rhythm without significant ectopy.- Personally Reviewed  ECG    Electrocardiogram 07/19/2017 with nonspecific ST abnormality.  No Q waves.- Personally Reviewed  Physical Exam  Obese, comfortable, and conversant. GEN: No acute distress.   Neck: No JVD Cardiac: RRR, no murmurs, rubs, or gallops.  Respiratory: Clear to auscultation bilaterally. GI: Soft, nontender, non-distended  MS: No edema; No deformity. Neuro:  Nonfocal  Psych: Normal affect   Labs    Chemistry Recent Labs  Lab 07/15/17 0952 07/18/17 1430 07/19/17 0330  NA 139 138 136  K 4.5 3.9 4.2  CL 106 107 102  CO2 24 21* 25  GLUCOSE 109*  122* 116*  BUN 20 16 15   CREATININE 1.02 1.06 0.98  CALCIUM 9.7 8.8* 8.8*  PROT 6.7  --   --   ALBUMIN 3.7  --   --   AST 20  --   --   ALT 19  --   --   ALKPHOS 45  --   --   BILITOT 0.7  --   --   GFRNONAA >60 >60 >60  GFRAA >60 >60 >60  ANIONGAP 9 10 9      Hematology Recent Labs  Lab 07/17/17 0426 07/18/17 0332 07/19/17 0330  WBC 5.2 5.6 9.5  RBC 4.65 4.76 4.49  HGB 15.3 15.4 14.5  HCT 44.6 45.4 42.4  MCV 95.9 95.2 94.4  MCH 32.8 32.3 32.3  MCHC 34.2 33.9 34.2  RDW 13.5 13.4 12.9  PLT 250 265 246    Cardiac Enzymes Recent Labs  Lab 07/15/17 2358 07/19/17 1202 07/19/17 1716 07/19/17 2352  TROPONINI 0.10* 9.91* 9.47* 9.34*   No results for input(s): TROPIPOC in the last 168 hours.   BNPNo results for input(s): BNP, PROBNP in the last 168 hours.   DDimer No results for input(s): DDIMER in the last 168 hours.   Radiology    No results found.  Cardiac Studies   2D Doppler echocardiogram 07/19/2017:  Study Conclusions   - Left ventricle: The cavity size was normal. There was mild focal   basal hypertrophy of the septum. Systolic function was normal.   The estimated ejection fraction was in the range of 55% to 60%.   There is akinesis of the midanteroseptal myocardium. Doppler   parameters are consistent with abnormal left ventricular   relaxation (grade 1 diastolic dysfunction). - Aortic valve: There was trivial regurgitation.   Impressions:   - Akinesis of the mid septum with overall preserved LV systolic   function; mild diastolic dysfunction; trace AI; no pericardial   effusion.   Patient Profile     72 y.o. male with coronary artery disease who presented with non-ST elevation myocardial infarction to [facility].  Cath with PCI to anatomically small LAD led to coronary perforation post stent implantation.  Reversal of anticoagulation and prolonged balloon inflation led to cessation of anteroseptal bleeding and was demonstrated by  angiography.  Small enzyme bump without significant EKG changes noted.  Assessment & Plan    1. Acute coronary syndrome with non-ST elevation myocardial infarction, small with preserved LV function, EF greater than 50%. 2. CAD with total occlusion of small anatomically LAD which was treated with PCI/stenting and complicated by vessel perforation with intramyocardial hematoma development.  Patient's postprocedure course has been stable.  Chest discomfort has resolved.  Assumption is that the LAD is totally occluded and despite this LV function remains normal.  No significant arrhythmias.  No murmur on exam to suggest pericarditis, VSD, or valve regurgitation. 3. Hyperlipidemia with LDL target less than 70.  Phase 1 cardiac rehab, ambulate, risk factor modification, potential discharge within the next 24 hours depending upon course.  For questions or updates, please contact [place] Please consult [email or web address] for contact info under Cardiology/STEMI.      Signed, Sinclair Grooms, MD  07/20/2017, 11:18 AM

## 2017-07-20 NOTE — Progress Notes (Signed)
CARDIAC REHAB PHASE I   PRE:  Rate/Rhythm: 71 SR  BP:  Supine: 116/74  Sitting:   Standing:    SaO2: 97%RA  MODE:  Ambulation: 740 ft   POST:  Rate/Rhythm: 88 SR  BP:  Supine:   Sitting: 122/76  Standing:    SaO2: 96%RA 1050-1153 Pt walked 740 ft on RA with hand held asst with steady gait. Tolerated well with no CP. MI education completed with pt ,wife and family who voiced understanding. Reviewed NTG use, MI restrictions, ex ed , heart healthy food choices, risk factors, and CRP 2.  Will refer to [facility] Phase 2. To recliner with call bell.   Graylon Good, RN BSN  07/20/2017 11:49 AM

## 2017-07-21 ENCOUNTER — Telehealth: Payer: Self-pay | Admitting: Cardiovascular Disease

## 2017-07-21 LAB — BASIC METABOLIC PANEL
Anion gap: 8 (ref 5–15)
BUN: 10 mg/dL (ref 6–20)
CHLORIDE: 103 mmol/L (ref 101–111)
CO2: 24 mmol/L (ref 22–32)
Calcium: 8.5 mg/dL — ABNORMAL LOW (ref 8.9–10.3)
Creatinine, Ser: 0.94 mg/dL (ref 0.61–1.24)
GFR calc Af Amer: >60 mL/min (ref >60)
GFR calc non Af Amer: >60 mL/min (ref >60)
GLUCOSE: 108 mg/dL — AB (ref 65–99)
POTASSIUM: 3.9 mmol/L (ref 3.5–5.1)
Sodium: 135 mmol/L (ref 135–145)

## 2017-07-21 MED ORDER — CLOPIDOGREL BISULFATE 75 MG PO TABS: 75.0000 mg | ORAL_TABLET | Freq: Every day | ORAL | 1 refills | Status: DC

## 2017-07-21 MED ORDER — NITROGLYCERIN 0.4 MG SL SUBL: 0.4000 mg | SUBLINGUAL_TABLET | SUBLINGUAL | 12 refills | Status: AC | PRN

## 2017-07-21 MED ORDER — METOPROLOL SUCCINATE ER 25 MG PO TB24
25.0000 mg | ORAL_TABLET | Freq: Every day | ORAL | 1 refills | Status: DC
Start: 2017-07-22 — End: 2017-07-29

## 2017-07-21 MED ORDER — ATORVASTATIN CALCIUM 80 MG PO TABS: 80.0000 mg | ORAL_TABLET | Freq: Every day | ORAL | 1 refills | Status: DC

## 2017-07-21 NOTE — Progress Notes (Signed)
Patient discharged home with wife.  All discharge instructions given to patient and questions answered.

## 2017-07-21 NOTE — Progress Notes (Signed)
CARDIAC REHAB PHASE I   PRE:  Rate/Rhythm: 85 SR  BP:  Supine:   Sitting: 107/92  Standing:    SaO2: 96%RA  MODE:  Ambulation: 740 ft   POST:  Rate/Rhythm: 85 SR  BP:  Supine:   Sitting: 103/71  Standing:    SaO2: 96%RA 1052-1130 Pt walked 740 ft on RA with steady gait. No CP. Tolerated well. Referring to [facility] CRP 2.   Graylon Good, RN BSN  07/21/2017 11:28 AM

## 2017-07-21 NOTE — Discharge Instructions (Signed)
Thank you for your visit to the [facility].   Heart Attack A heart attack (myocardial infarction, MI) is a condition that occurs when an artery in the heart (coronary artery) becomes narrowed or blocked. The narrowing or blockage cuts off the blood and oxygen supply to the heart, which permanently damages the heart. When one or more of your coronary arteries becomes blocked, that area of your heart begins to die. This causes symptoms felt during a heart attack. A heart attack is a medical emergency. If you think you are having a heart attack, do not wait to see if the symptoms will go away. Get medical help right away. What are the causes? Many conditions can cause a heart attack, including:  Atherosclerosis. This is when a fatty substance (plaque) gradually builds up in the arteries. This buildup can block or reduce blood supply to one or more of the coronary arteries. This is the most common cause of heart attack.  A blood clot. A blood clot can develop suddenly when plaque breaks up (ruptures) within a coronary artery. A blood clot can block the artery, which prevents blood flow to the heart.  Low blood pressure (hypotension).  An abnormal heart beat (arrhythmia).  Severe tightening (spasm) of a coronary artery. This cuts off blood flow through the artery.  Tearing of a coronary artery (spontaneous coronary artery dissection).  What increases the risk? The following factors may make you more likely to develop this condition:  Age. The older a person is the higher the risk.  A history of heart attack or stroke.  Being male.  A family history of chest pain, heart disease, or stroke.  Smoking.  Lack of exercise.  Being overweight or obese.  High blood pressure (hypertension).  High cholesterol.  Diabetes.  Stress.  Drinking too much alcohol.  Using drugs, such as cocaine or methamphetamine.  What are the signs or symptoms? Symptoms of this condition may vary  depending on factors like gender and age. Symptoms may include:  Chest pain. This may feel like crushing, squeezing, tightness, or pressure.  Pain in the arm, neck, jaw, back, or upper body.  Shortness of breath. This may be at rest or with minimal activity.  Heartburn or indigestion.  Nausea.  Sudden cold sweats.  Feeling tired.  Sudden light-headedness.  Older people may have more subtle symptoms, such as general tiredness or not feeling well. How is this diagnosed? This condition may be diagnosed through tests, such as:  Electrocardiogram (ECG) to measure the electrical activity of your heart.  Blood tests in which blood is drawn at scheduled times over several hours to check for the specific proteins or enzymes released by damaged heart muscle (cardiac markers).  Coronary angiogram. This is a procedure in which dye is injected into arteries and then X-rays are taken to evaluate blood flow and heart function.  CT scan of the chest, using dye to make it easy to see the heart.  Echocardiogram to evaluate heart motion and blood flow.  How is this treated? A heart attack must be treated as soon as possible. Treatment may include:  Oxygen therapy.  Medicines, such as: ? Medicine that breaks up or dissolves blood clots in the coronary artery (fibrinolytic therapy). ? Antiplatelet medicines and blood-thinning medicines, such as aspirin. These help prevent blood clots. ? Blood pressure medicines. ? Nitroglycerin. This medicine improves blood flow to the heart. ? Pain medicine. ? Cholesterol-reducing medicine.  Angioplasty and cardiac stent placement. This is  a procedure to widen a blocked coronary artery by placing a small piece of metal that looks like a coil or spring (stent) into the artery. The stent helps keep the artery open by supporting the artery walls.  Open heart surgery (coronary artery bypass graft, CABG). In this procedure, a section of blood vessel from  another part of the body (graft) is removed (harvested) and then inserted where it will allow blood to go around (bypass) the damaged part of the coronary artery.  Cardiac rehabilitation. This is a program that helps improve your health and well-being. It includes exercise training, education, and counseling to help you recover.  Follow these instructions at home: Medicines  Take over-the-counter and prescription medicines only as told by your health care provider. You may need to take medicine after a heart attack to: ? Keep your blood from clotting too easily. ? Control your blood pressure and heart rate. ? Lower your cholesterol. ? Control abnormal heart rhythms.  Do not take the following medicines unless your health care provider says it is okay to take them: ? NSAIDs, such as ibuprofen, naproxen, or celecoxib (COX-2 inhibitors). ? Vitamin supplements that contain vitamin A, vitamin E, or both. ? Hormone replacement therapy that contains estrogen with or without progestin. Lifestyle  Do not use any products that contain nicotine or tobacco, such as cigarettes and e-cigarettes. If you need help quitting, ask your health care provider.  Avoid secondhand smoke.  Exercise regularly. Ask your health care provider about participating in a cardiac rehabilitation program that helps you start exercising safely after a heart attack.  Eat a heart-healthy diet. Consider working with a diet and nutrition specialist (registered dietitian). A dietitian can help you learn healthy eating options.  Maintain a healthy weight.  Reduce your stress level.  Limit alcohol intake to no more than 1 drink a day for non-pregnant women and 2 drinks a day for men. One drink equals 12 oz. of beer, 5 oz. of wine, or 1 oz. of hard liquor. General instructions  Work with your health care provider to manage any other conditions you have, such as hypertension or diabetes. These conditions affect your  heart.  Get screened for depression, and seek treatment if needed.  Keep your vaccinations up to date. Get the flu (influenza) vaccine every year.  Keep all follow-up visits as told by your health care provider. This is important. Get help right away if:  You have sudden, unexplained discomfort in your chest, arms, back, neck, jaw, or upper body.  You have shortness of breath at any time.  You suddenly start to sweat or your skin gets clammy.  You feel nauseous or you vomit.  You suddenly feel light-headed or dizzy.  Your heart starts to beat fast or feels like it is skipping beats.  Your blood pressure is higher than 180/120.  You have thoughts about harming yourself or taking your own life. These symptoms may represent a serious problem that is an emergency. Do not wait to see if the symptoms will go away. Get medical help right away. Call your local emergency services (911 in the [place] Do not drive yourself to the hospital. Summary  A heart attack (myocardial infarction, MI) is a condition that occurs when an artery in the heart (coronary artery) becomes narrowed or blocked. The narrowing or blockage cuts off the blood and oxygen supply to the heart, which permanently damages the heart.  A heart attack is an emergency. Get help right away  if you have sudden pain in your chest, arms, back, jaw, or upper body. Seek help if you have nausea or you vomit, or you become lightheaded or dizzy.  Treatment is a combination of oxygen, medicines and procedures, if needed, to open the blocked artery and restore blood flow to the heart. This information is not intended to replace advice given to you by your health care provider. Make sure you discuss any questions you have with your health care provider. Document Released: 04/12/2005 Document Revised: 05/18/2016 Document Reviewed: 05/18/2016 Elsevier Interactive Patient Education  [employer].

## 2017-07-21 NOTE — Care Management Important Message (Signed)
Important Message  Patient Details  Name: Jeremiah [name] MRN: [id number] Date of Birth: 09/03/1945   Medicare Important Message Given:  Yes    Maryclare Labrador, RN 07/21/2017, 2:26 PM

## 2017-07-21 NOTE — Telephone Encounter (Signed)
Patient currently admitted at this time. 

## 2017-07-21 NOTE — Progress Notes (Addendum)
The patient has been seen in conjunction with Kathi Ludwig, MD. All aspects of care have been considered and discussed. The patient has been personally interviewed, examined, and all clinical data has been reviewed.   Plan discharge today on high intensity statin therapy, metoprolol succinate, and aspirin.  Transition of care follow-up with Dr. Fletcher Anon in 7-10 days.  Phase 2 cardiac rehab at [facility].  The patient is ambulated without chest discomfort.  No pericardial rub is heard.  Lungs are clear.  Vital signs are stable.  All laboratory data is stable.  Progress Note  Patient Name: Jeremiah [name] Date of Encounter: 07/21/2017  Primary Cardiologist: Kathlyn Sacramento, MD   Subjective   The patient feels well this morning. Has been chest pain free for "days" now. Is able to walk the hallway without pain or concerns.   Inpatient Medications    Scheduled Meds: . aspirin EC  81 mg Oral Daily  . atorvastatin  80 mg Oral q1800  . metoprolol succinate  25 mg Oral Daily  . sodium chloride flush  3 mL Intravenous Q12H   Continuous Infusions: . sodium chloride Stopped (07/19/17 0500)   PRN Meds: sodium chloride, acetaminophen, HYDROmorphone (DILAUDID) injection, morphine injection, nitroGLYCERIN, ondansetron (ZOFRAN) IV, sodium chloride flush   Vital Signs    Vitals:   07/21/17 0000 07/21/17 0008 07/21/17 0200 07/21/17 0400  BP:   (!) 100/59   Pulse:      Resp: (!) 24  (!) 22   Temp:  99.9 F (37.7 C)  99.3 F (37.4 C)  TempSrc:  Oral  Oral  SpO2:      Weight:      Height:        Intake/Output Summary (Last 24 hours) at 07/21/2017 5465 Last data filed at 07/20/2017 2200 Gross per 24 hour  Intake 956 ml  Output 575 ml  Net 381 ml   Filed Weights   07/18/17 1239  Weight: 95.3 kg (210 lb)    Telemetry    NSR - Personally Reviewed  ECG    No recent tracings - Personally Reviewed  Physical Exam  Conversant. Discussing his  healthcare.  GEN: No acute distress.   Neck: No JVD Cardiac: RRR, no murmurs, rubs, or gallops.  Respiratory: Clear to auscultation bilaterally. GI: Soft, nontender, non-distended  MS: No edema; No deformity. Neuro:  Nonfocal  Psych: Normal affect   Labs    Chemistry Recent Labs  Lab 07/15/17 0952 07/18/17 1430 07/19/17 0330 07/21/17 0257  NA 139 138 136 135  K 4.5 3.9 4.2 3.9  CL 106 107 102 103  CO2 24 21* 25 24  GLUCOSE 109* 122* 116* 108*  BUN 20 16 15 10   CREATININE 1.02 1.06 0.98 0.94  CALCIUM 9.7 8.8* 8.8* 8.5*  PROT 6.7  --   --   --   ALBUMIN 3.7  --   --   --   AST 20  --   --   --   ALT 19  --   --   --   ALKPHOS 45  --   --   --   BILITOT 0.7  --   --   --   GFRNONAA >60 >60 >60 >60  GFRAA >60 >60 >60 >60  ANIONGAP 9 10 9 8      Hematology Recent Labs  Lab 07/17/17 0426 07/18/17 0332 07/19/17 0330  WBC 5.2 5.6 9.5  RBC 4.65 4.76 4.49  HGB 15.3 15.4 14.5  HCT 44.6 45.4 42.4  MCV 95.9 95.2 94.4  MCH 32.8 32.3 32.3  MCHC 34.2 33.9 34.2  RDW 13.5 13.4 12.9  PLT 250 265 246    Cardiac Enzymes Recent Labs  Lab 07/15/17 2358 07/19/17 1202 07/19/17 1716 07/19/17 2352  TROPONINI 0.10* 9.91* 9.47* 9.34*   No results for input(s): TROPIPOC in the last 168 hours.   BNPNo results for input(s): BNP, PROBNP in the last 168 hours.   DDimer No results for input(s): DDIMER in the last 168 hours.   Radiology    No results found.  Cardiac Studies   Echo 07/19/2017: Study Conclusions - Left ventricle: The cavity size was normal. There was mild focal basal hypertrophy of the septum. Systolic function was normal. The estimated ejection fraction was in the range of 55% to 60%. There is akinesis of the midanteroseptal myocardium. Doppler parameters are consistent with abnormal left ventricular relaxation (grade 1 diastolic dysfunction). - Aortic valve: There was trivial regurgitation. Echo completed indicating an LVEF of 47-82% grade 1  diastolic dysfunction Impressions: - Akinesis of the mid septum with overall preserved LV systolic function; mild diastolic dysfunction; trace AI; no pericardial effusion.  Echocardiogram 3/2 repeat echo the following day again failed to demonstrate more than trivial pericardial effusion5/2019: Study Conclusions  - Left ventricle: The cavity size was normal. There was mild concentric hypertrophy. Systolic function was at the lower limits of normal. The estimated ejection fraction was in the range of 50% to 55%. Severe hypokinesis of the apicalanteroseptal myocardium; consistent with ischemia in the distribution of the left anterior descending coronary artery. Doppler parameters are consistent with abnormal left ventricular relaxation (grade 1 diastolic dysfunction). - Aortic valve: There was mild regurgitation. - Pericardium, extracardiac: A trivial pericardial effusion was identified.  Echocardiogram 07/18/2017 post cath: Study Conclusions  - Left ventricle: The cavity size was normal. Systolic function was normal. The estimated ejection fraction was in the range of 50% to 55%. There is severe hypokinesis of the anteroseptal and inferoseptal myocardium. - Pulmonary arteries: Systolic pressure could not be accurately estimated. - Pericardium, extracardiac: There was no pericardial effusion.  Cardiac cath 07/18/2017:  Ost RCA to Prox RCA lesion is 30% stenosed.  Mid RCA lesion is 30% stenosed.  Prox LAD lesion is 60% stenosed.  Mid LAD lesion is 100% stenosed.  1. Severe one-vessel coronary artery disease with occluded mid LAD with collaterals from the right coronary artery. The LAD system appears to be small due to large first diagonal and right coronary artery.  2. 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 Echocardiogram showed no significant pericardial effusion.  Recommendations: The patient started having chest pain and EKG changes as soon as he was placed on the Cath Lab table before doing that diagnostic catheterization. I suspect that he had acute on chronic occlusion of the mid LAD with some visible collaterals from the right coronary artery. Treat with aspirin and high-dose statin for now. Monitor the patient closely and repeat echocardiogram as soon as he arrives to [facility] to ensure no pericardial effusion. If he remains hemodynamically stable, most likely he will be treated medically. I discussed the case with Dr. Ellyn Hack and Dr. Saralyn Pilar. I also gave a report to Dr. Tamala Julian who accepted the patient.  Patient Profile     72 y.o. male with  apast medical history notable for hypertension, HLD, leukopenia, sinus bradycardia who presented to the hospital with chest pain found to have an end STEMI. He underwent cardiac catheterization on 07/18/2017 with attempted PCI which was ultimately unsuccessful. There was a dissection into a septal branch during attempted cannulization of the occluded mid LAD which was treated with prolonged balloon inflation reversal of anticoagulation. There are significant collaterals from the RCA distal to the affected branch. Post catheter echo failed to demonstrate pericardial effusion. Repeat echo the following day again failed to demonstrate with a trivial pericardial effusion,conservative medical management was recommended. The patient continued to improve throughout his stay.  Assessment & Plan    NSTEMI: Chest pain free. Ambulating without pain. He continues to improve daily and appears stable for discharge home today or early tomorrow. Echo repeated x 2 without evidence of pericardial effusion. Stable for discharge home today. Will initiate the process in early afternoon.  --Continue  ASA on discharge --Continue metoprolol succinate 25mg   --Continue NTG PRN for chest pain --Continue cardiac rehab, will need phase two orders at [facility] --Patient transferred to stepdown when bed available --Continue atorvastatin 80mg  daily  HTN: Pressure soft since admission. Will follow-up as an outpatient in 7-10 days.  HLD: Lipid profile with increased LDL. Continue atorvastatin 80mg  daily and follow-up in 6-8 weeks for repeat lipid profile.   For questions or updates, please contact [place] Please consult [email or web address] for contact info under Cardiology/STEMI.   Please see attending note/attestatino for current A/P.    Signed, Kathi Ludwig, MD Internal Medicine, PGY-1 Pager # [phone]

## 2017-07-21 NOTE — Telephone Encounter (Signed)
TCM....  Patient is being discharged   They saw C. Sharolyn Douglas  They are scheduled to see R. Dunn on 5/6  They were seen for acute coronary syndrome/non-ST segment elevation myocardial infarction   They need to be seen within 7 to 10 days  Pt added to wait list   Please call

## 2017-07-22 NOTE — Telephone Encounter (Addendum)
Per Dr. Mallie Mussel Smith's note dated 07/21/17:  "Transition of care follow-up with Dr. Fletcher Anon in 7-10 days."  Scheduled 4/5, 3:20pm with Dr. Fletcher Anon.  Patient contacted regarding discharge from [facility] on 3/28.  Patient understands to follow up with provider Dr. Fletcher Anon on April 5 at 3:20pm at [facility], [place]. Patient understands discharge instructions? yes Patient understands medications and regiment? yes Patient understands to bring all medications to this visit? yes   Patient reports being restless at night but thinks it's because he "hasn't gotten back in the swing of things".  He has been getting 4 hours of sleep; normally 5-5.5 hours each night.  He was discharged on atorvastatin 80mg . Previously took simvastatin 20mg . Denies myalgia or any other symptoms.  Patient understands to call if restlessness does not improve or if he develops other symptoms as it could be a s/e of atorvastatin.  Pt agreeable with plan with no further questions at this time.

## 2017-07-22 NOTE — Discharge Summary (Addendum)
The patient has been seen in conjunction with Kathi Ludwig, MD. All aspects of care have been considered and discussed. The patient has been personally interviewed, examined, and all clinical data has been reviewed.   Please see my notes from earlier on the date of discharge.  The patient is felt stable and able to be discharged.  We will have a transition of care follow-up within 1 week.  Discharge Summary    Patient ID: Jeremiah [name],  MRN: [id number], DOB/AGE: [birth year] 72 y.o.  Admit date: 07/18/2017 Discharge date: 07/22/2017  Primary Care Provider: Patient, No Pcp Per Primary Cardiologist: Kathlyn Sacramento, MD  Discharge Diagnoses    Active Problems:   NSTEMI (non-ST elevated myocardial infarction) [facility])   Coronary perforation during PCI  Allergies No Known Allergies  Diagnostic Studies/Procedures     Echo 07/19/2017: Study Conclusions - Left ventricle: The cavity size was normal. There was mild focal basal hypertrophy of the septum. Systolic function was normal. The estimated ejection fraction was in the range of 55% to 60%. There is akinesis of the midanteroseptal myocardium. Doppler parameters are consistent with abnormal left ventricular relaxation (grade 1 diastolic dysfunction). - Aortic valve: There was trivial regurgitation. Echo completed indicating an LVEF of 53-61% grade 1 diastolic dysfunction Impressions: - Akinesis of the mid septum with overall preserved LV systolic function; mild diastolic dysfunction; trace AI; no pericardial effusion.  Echocardiogram 3/2 repeat echo the following day again failed to demonstrate more than trivial pericardial effusion5/2019: Study Conclusions  - Left ventricle: The cavity size was normal. There was mild concentric hypertrophy. Systolic function was at the lower limits of normal. The estimated ejection fraction was in the range of 50% to 55%. Severe hypokinesis of the  apicalanteroseptal myocardium; consistent with ischemia in the distribution of the left anterior descending coronary artery. Doppler parameters are consistent with abnormal left ventricular relaxation (grade 1 diastolic dysfunction). - Aortic valve: There was mild regurgitation. - Pericardium, extracardiac: A trivial pericardial effusion was identified.  Echocardiogram 07/18/2017 post cath: Study Conclusions  - Left ventricle: The cavity size was normal. Systolic function was normal. The estimated ejection fraction was in the range of 50% to 55%. There is severe hypokinesis of the anteroseptal and inferoseptal myocardium. - Pulmonary arteries: Systolic pressure could not be accurately estimated. - Pericardium, extracardiac: There was no pericardial effusion.  Cardiac cath 07/18/2017:  Ost RCA to Prox RCA lesion is 30% stenosed.  Mid RCA lesion is 30% stenosed.  Prox LAD lesion is 60% stenosed.  Mid LAD lesion is 100% stenosed.  1. Severe one-vessel coronary artery disease with occluded mid LAD with collaterals from the right coronary artery. The LAD system appears to be small due to large first diagonal and right coronary artery.  2. 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 Echocardiogram showed no significant pericardial effusion.  Recommendations: The patient started having chest pain and EKG changes as soon as he was placed on the Cath Lab table before doing that diagnostic catheterization. I suspect that he had acute on chronic occlusion of the mid LAD with some visible collaterals from the right coronary artery. Treat with aspirin and high-dose statin for now. Monitor the patient closely and repeat echocardiogram as soon as he arrives to [facility] to  ensure no pericardial effusion. If he remains hemodynamically  stable, most likely he will be treated medically. I discussed the case with Dr. Ellyn Hack and Dr. Saralyn Pilar. I also gave a report to Dr. Tamala Julian who accepted the patient. _____________   History of Present Illness     72 y.o. male with apast medical history notable for hypertension, HLD, leukopenia, sinus bradycardia who presented to the hospital with chest pain that began without warning and occurred without radiation. He experienced associated nausea, but no diaphoresis. He was found to have positive troponin levels without coorisponding EKG changes consistent with and NSTEMI.  Hospital Course     He underwent cardiac catheterization on 3/25/2019with attempted PCIwhich was ultimately unsuccessful. There was adissection into a septal branchduring attempted cannulization of the occluded mid LADwhich was treated with prolonged balloon inflation reversal of anticoagulation. There are significant collaterals from the RCA distal to the affected branch supplying flow to the affected cardiac tissue. Post catheter echo failed to demonstrate pericardial effusion. Repeat echo the following day again failed to demonstrate more than a trivial pericardial effusion, and as suchconservative medical management was recommended.The patient continued to improve throughout his stay. He was placed on ASA 81mg  daily, metoprolol 25mg  BID, atorvastatin 80mg  daily and referred to cardiac rehab phase II.  The patient was advised to decrease his stress load and to increase his activity as permitted by the rehab personal and his follow-up physician. HE was discharged home in stable condition having been chest pain free for >72 hours and passing phase I cardiac rehab. _____________  Discharge Vitals Blood pressure 121/68, pulse 67, temperature 99 F (37.2 C), temperature source Oral, resp. rate 17, height 5\' 9"  (1.753 m), weight 95.3 kg (210 lb), SpO2 96 %.    Filed Weights   07/18/17 1239  Weight: 95.3 kg (210 lb)    Labs & Radiologic Studies    CBC No results for input(s): WBC, NEUTROABS, HGB, HCT, MCV, PLT in the last 72 hours. Basic Metabolic Panel Recent Labs    07/21/17 0257  NA 135  K 3.9  CL 103  CO2 24  GLUCOSE 108*  BUN 10  CREATININE 0.94  CALCIUM 8.5*   Liver Function Tests No results for input(s): AST, ALT, ALKPHOS, BILITOT, PROT, ALBUMIN in the last 72 hours. No results for input(s): LIPASE, AMYLASE in the last 72 hours. Cardiac Enzymes Recent Labs    07/19/17 1716 07/19/17 2352  TROPONINI 9.47* 9.34*   BNP Invalid input(s): POCBNP D-Dimer No results for input(s): DDIMER in the last 72 hours. Hemoglobin A1C No results for input(s): HGBA1C in the last 72 hours. Fasting Lipid Panel No results for input(s): CHOL, HDL, LDLCALC, TRIG, CHOLHDL, LDLDIRECT in the last 72 hours. Thyroid Function Tests No results for input(s): TSH, T4TOTAL, T3FREE, THYROIDAB in the last 72 hours.  Invalid input(s): FREET3 _____________  Dg Chest 2 View  Result Date: 07/15/2017 CLINICAL DATA:  Chest pain. EXAM: CHEST - 2 VIEW COMPARISON:  None. FINDINGS: The heart size and mediastinal contours are within normal limits. Normal pulmonary vascularity. Atherosclerotic calcification of the aortic arch. No focal consolidation, pleural effusion, or pneumothorax. No acute osseous abnormality. IMPRESSION: 1.  No active cardiopulmonary disease. 2.  Aortic atherosclerosis (ICD10-I70.0). Electronically Signed   By: Titus Dubin M.D.   On: 07/15/2017 10:52   Disposition   Pt is being discharged home today in good condition.  Follow-up Plans & Appointments     Discharge Instructions    Amb Referral to Cardiac Rehabilitation   Complete by:  As directed  Diagnosis:  NSTEMI Comment - unsuccessful stent   Call MD for:  extreme fatigue   Complete by:  As directed    Call MD for:  persistant dizziness or light-headedness   Complete by:   As directed    Call MD for:  severe uncontrolled pain   Complete by:  As directed    Diet - low sodium heart healthy   Complete by:  As directed    Increase activity slowly   Complete by:  As directed       Discharge Medications   Allergies as of 07/21/2017   No Known Allergies     Medication List    STOP taking these medications   simvastatin 20 MG tablet Commonly known as:  ZOCOR     TAKE these medications   aspirin EC 81 MG tablet Take 81 mg by mouth daily.   atorvastatin 80 MG tablet Commonly known as:  LIPITOR Take 1 tablet (80 mg total) by mouth daily at 6 PM.   cetirizine 10 MG tablet Commonly known as:  ZYRTEC Take 10 mg by mouth daily.   cholecalciferol 1000 units tablet Commonly known as:  VITAMIN D Take 1,000 Units by mouth daily.   Fish Oil 1000 MG Caps Take 1,000 mg by mouth daily.   metoprolol succinate 25 MG 24 hr tablet Commonly known as:  TOPROL-XL Take 1 tablet (25 mg total) by mouth daily.   nitroGLYCERIN 0.4 MG SL tablet Commonly known as:  NITROSTAT Place 1 tablet (0.4 mg total) under the tongue every 5 (five) minutes x 3 doses as needed for chest pain.   vitamin B-12 1000 MCG tablet Commonly known as:  CYANOCOBALAMIN Take 1,000 mcg by mouth daily.        Aspirin prescribed at discharge?  Yes High Intensity Statin Prescribed? (Lipitor 40-80mg  or Crestor 20-40mg ): Yes Beta Blocker Prescribed? Yes For EF <40%, was ACEI/ARB Prescribed? No: Hypotension N/A ADP Receptor Inhibitor Prescribed? (i.e. Plavix etc.-Includes Medically Managed Patients): No: Dissection, no stent placed For EF <40%, Aldosterone Inhibitor Prescribed? No: N/A Was EF assessed during THIS hospitalization? Yes Was Cardiac Rehab II ordered? (Included Medically managed Patients): Yes   Outstanding Labs/Studies   Duration of Discharge Encounter   Greater than 30 minutes including physician time.  Derenda Fennel MD 07/22/2017, 5:11 PM

## 2017-07-28 ENCOUNTER — Ambulatory Visit: Payer: Medicare HMO | Admitting: Physician Assistant

## 2017-07-28 ENCOUNTER — Ambulatory Visit: Payer: Medicare HMO

## 2017-07-29 ENCOUNTER — Ambulatory Visit: Payer: Medicare HMO | Admitting: Cardiovascular Disease

## 2017-07-29 ENCOUNTER — Encounter: Payer: Self-pay | Admitting: Cardiovascular Disease

## 2017-07-29 VITALS — BP 130/66 | HR 50 | Ht 69.0 in | Wt 207.2 lb

## 2017-07-29 DIAGNOSIS — I252 Old myocardial infarction: Secondary | ICD-10-CM | POA: Diagnosis not present

## 2017-07-29 DIAGNOSIS — E7800 Pure hypercholesterolemia, unspecified: Secondary | ICD-10-CM | POA: Diagnosis not present

## 2017-07-29 MED ORDER — METOPROLOL SUCCINATE ER 25 MG PO TB24: 25.0000 mg | ORAL_TABLET | Freq: Every day | ORAL | 3 refills | Status: AC

## 2017-07-29 MED ORDER — ATORVASTATIN CALCIUM 40 MG PO TABS: 40.0000 mg | ORAL_TABLET | Freq: Every day | ORAL | 3 refills | Status: DC

## 2017-07-29 MED ORDER — METOPROLOL SUCCINATE ER 25 MG PO TB24: 25.0000 mg | ORAL_TABLET | Freq: Every day | ORAL | 3 refills | Status: DC

## 2017-07-29 NOTE — Progress Notes (Signed)
Cardiology Office Note   Date:  07/29/2017   ID:  Jeremiah [name], DOB 22-Mar-1946, MRN [id number]  PCP:  System, Pcp Not In  Cardiologist:   Kathlyn Sacramento, MD   Chief Complaint  Patient presents with  . Other    7 to 10 day [facility] follow up. f/u for Acute coronary syndrome/non-ST segment elevation myocardial infarction. Meds reviewed vebally with patient.       History of Present Illness: Jeremiah [name] is a 72 y.o. male who presents for follow-up visit regarding coronary artery disease status post recent non-ST elevation myocardial infarction.  He has known history of hypertension and hyperlipidemia and presented recently with a small non-ST elevation myocardial infarction on Friday.  He remained chest pain-free throughout the weekend and cardiac catheterization was scheduled on Monday morning.  Just before cardiac catheterization, he started having chest pain with anterior ST changes.  Catheterization showed occluded mid LAD which turned out to be very small overall given very large and dominant right coronary artery.  I attempted PCI of the LAD but could not restore flow due to small caliber vessel and diffuse disease.  Ultimately, the procedure was complicated by a wire perforation of the septal perforator.  This was managed by reversing anticoagulation and prolonged balloon inflation proximally.  There was no pericardial effusion.  Echo showed an EF of 50-55% with localized akinesis in the anteroseptal myocardium. The patient has been doing reasonably well since hospital discharge with no recurrent chest pain or shortness of breath.  He has not fully resume his physical activities.    Past Medical History:  Diagnosis Date  . Chest pain    a. 10/2014 Neg MV @ [facility].  . Colon polyps   . Hydrocele    a. 10/2013 s/p L epididymectomy and hyrocele repair.  Marland Kitchen Hyperlipidemia   . Hypertension   . Leukopenia   . Sinus bradycardia     Past Surgical History:  Procedure Laterality Date  .  CORONARY STENT INTERVENTION N/A 07/18/2017   Procedure: CORONARY STENT INTERVENTION;  Surgeon: Wellington Hampshire, MD;  Location: [place] [facility];  Service: Cardiovascular;  Laterality: N/A;  . EPIDIDYMECTOMY UNILATERAL    . HYDROCELE EXCISION / REPAIR Left 11/13/2013  . LEFT HEART CATH AND CORONARY ANGIOGRAPHY N/A 07/18/2017   Procedure: LEFT HEART CATH AND CORONARY ANGIOGRAPHY;  Surgeon: Wellington Hampshire, MD;  Location: [place] [facility];  Service: Cardiovascular;  Laterality: N/A;     Current Outpatient Medications  Medication Sig Dispense Refill  . aspirin EC 81 MG tablet Take 81 mg by mouth daily.    Marland Kitchen atorvastatin (LIPITOR) 80 MG tablet Take 1 tablet (80 mg total) by mouth daily at 6 PM. 30 tablet 1  . cetirizine (ZYRTEC) 10 MG tablet Take 10 mg by mouth daily.    . cholecalciferol (VITAMIN D) 1000 units tablet Take 1,000 Units by mouth daily.    . metoprolol succinate (TOPROL-XL) 25 MG 24 hr tablet Take 1 tablet (25 mg total) by mouth daily. 30 tablet 1  . nitroGLYCERIN (NITROSTAT) 0.4 MG SL tablet Place 1 tablet (0.4 mg total) under the tongue every 5 (five) minutes x 3 doses as needed for chest pain. 15 tablet 12  . Omega-3 Fatty Acids (FISH OIL) 1000 MG CAPS Take 1,000 mg by mouth daily.     . vitamin B-12 (CYANOCOBALAMIN) 1000 MCG tablet Take 1,000 mcg by mouth daily.     No current facility-administered medications for this visit.  Allergies:   Patient has no known allergies.    Social History:  The patient  reports that he has never smoked. He has never used smokeless tobacco. He reports that he drank alcohol. He reports that he does not use drugs.   Family History:  The patient's family history includes COPD in his mother; Cancer - Colon in his father; Heart attack in his father.    ROS:  Please see the history of present illness.   Otherwise, review of systems are positive for none.   All other systems are reviewed and negative.    PHYSICAL EXAM: VS:  BP  130/66 (BP Location: Left Arm, Patient Position: Sitting, Cuff Size: Normal)   Pulse (!) 50   Ht 5\' 9"  (1.753 m)   Wt 207 lb 4 oz (94 kg)   BMI 30.61 kg/m  , BMI Body mass index is 30.61 kg/m. GEN: Well nourished, well developed, in no acute distress  HEENT: normal  Neck: no JVD, carotid bruits, or masses Cardiac: RRR; no murmurs, rubs, or gallops,no edema  Respiratory:  clear to auscultation bilaterally, normal work of breathing GI: soft, nontender, nondistended, + BS MS: no deformity or atrophy  Skin: warm and dry, no rash Neuro:  Strength and sensation are intact Psych: euthymic mood, full affect No hematoma in the right radial area but I cannot feel his right radial pulse.   EKG:  EKG is ordered today. The ekg ordered today demonstrates sinus bradycardia with no significant ST or T wave changes.   Recent Labs: 07/15/2017: ALT 19 07/19/2017: Hemoglobin 14.5; Platelets 246 07/21/2017: BUN 10; Creatinine, Ser 0.94; Potassium 3.9; Sodium 135    Lipid Panel    Component Value Date/Time   CHOL 159 07/19/2017 0330   TRIG 111 07/19/2017 0330   HDL 45 07/19/2017 0330   CHOLHDL 3.5 07/19/2017 0330   VLDL 22 07/19/2017 0330   LDLCALC 92 07/19/2017 0330      Wt Readings from Last 3 Encounters:  07/29/17 207 lb 4 oz (94 kg)  07/18/17 210 lb (95.3 kg)  07/18/17 209 lb (94.8 kg)       No flowsheet data found.    ASSESSMENT AND PLAN:  1.  Coronary artery disease involving native coronary arteries without angina: He is doing reasonably well after recent myocardial infarction.  He has no anginal symptoms.  I recommend continuing medical therapy.  I advised him to attend cardiac rehab but he wants to exercise on his own.  Catheterization did show 60% disease in the proximal LAD supplying a large diagonal branch.  This should be monitored closely.  I am planning to do a treadmill stress test in 3-6 months.  2.  Hyperlipidemia: Continue treatment with atorvastatin: He decreased  the dose to 40 mg daily as he felt some side effects with the higher dose.  Check lipid and liver profile in 1 month.    Disposition:   FU with me in 2 months  Signed,  Kathlyn Sacramento, MD  07/29/2017 3:49 PM    [place]

## 2017-07-29 NOTE — Patient Instructions (Signed)
Medication Instructions:  Your physician recommends that you continue on your current medications as directed. Please refer to the Current Medication list given to you today.   Labwork: Fasting lipid and liver profile in one month at the [facility] lab. No appt needed.   Testing/Procedures: none  Follow-Up: Your physician recommends that you schedule a follow-up appointment in: 2 months with Dr. Fletcher Anon   Any Other Special Instructions Will Be Listed Below (If Applicable).     If you need a refill on your cardiac medications before your next appointment, please call your pharmacy.

## 2017-08-02 ENCOUNTER — Encounter: Payer: Self-pay | Admitting: Cardiovascular Disease

## 2017-08-29 ENCOUNTER — Ambulatory Visit: Payer: Medicare HMO | Admitting: Anesthesiology

## 2017-08-29 ENCOUNTER — Ambulatory Visit
Admission: RE | Admit: 2017-08-29 | Discharge: 2017-08-29 | Disposition: A | Discharge status: Home / Self Care | Payer: Medicare HMO | Source: Ambulatory Visit | Attending: Unknown Physician Specialty | Admitting: Unknown Physician Specialty

## 2017-08-29 ENCOUNTER — Ambulatory Visit: Payer: Medicare HMO | Admitting: Physician Assistant

## 2017-08-29 ENCOUNTER — Encounter

## 2017-08-29 ENCOUNTER — Encounter: Payer: Self-pay | Admitting: *Deleted

## 2017-08-29 ENCOUNTER — Encounter
Admission: RE | Disposition: A | Discharge status: Home / Self Care | Payer: Self-pay | Source: Ambulatory Visit | Attending: Unknown Physician Specialty

## 2017-08-29 DIAGNOSIS — E785 Hyperlipidemia, unspecified: Secondary | ICD-10-CM | POA: Diagnosis not present

## 2017-08-29 DIAGNOSIS — Z955 Presence of coronary angioplasty implant and graft: Secondary | ICD-10-CM | POA: Diagnosis not present

## 2017-08-29 DIAGNOSIS — I10 Essential (primary) hypertension: Secondary | ICD-10-CM | POA: Insufficient documentation

## 2017-08-29 DIAGNOSIS — D122 Benign neoplasm of ascending colon: Secondary | ICD-10-CM | POA: Insufficient documentation

## 2017-08-29 DIAGNOSIS — K648 Other hemorrhoids: Secondary | ICD-10-CM | POA: Insufficient documentation

## 2017-08-29 DIAGNOSIS — G4730 Sleep apnea, unspecified: Secondary | ICD-10-CM | POA: Diagnosis not present

## 2017-08-29 DIAGNOSIS — D123 Benign neoplasm of transverse colon: Secondary | ICD-10-CM | POA: Insufficient documentation

## 2017-08-29 DIAGNOSIS — D120 Benign neoplasm of cecum: Secondary | ICD-10-CM | POA: Insufficient documentation

## 2017-08-29 DIAGNOSIS — Z1211 Encounter for screening for malignant neoplasm of colon: Secondary | ICD-10-CM | POA: Diagnosis present

## 2017-08-29 DIAGNOSIS — Z800 Family history of malignant neoplasm of digestive organs: Secondary | ICD-10-CM | POA: Insufficient documentation

## 2017-08-29 DIAGNOSIS — I252 Old myocardial infarction: Secondary | ICD-10-CM | POA: Diagnosis not present

## 2017-08-29 HISTORY — PX: COLONOSCOPY WITH PROPOFOL: SHX5780

## 2017-08-29 HISTORY — DX: Acute myocardial infarction, unspecified: I21.9

## 2017-08-29 SURGERY — COLONOSCOPY WITH PROPOFOL
Anesthesia: General

## 2017-08-29 MED ORDER — PROPOFOL 10 MG/ML IV BOLUS
INTRAVENOUS | Status: DC | PRN
  Administered 2017-08-29: 30 mg via INTRAVENOUS

## 2017-08-29 MED ORDER — LIDOCAINE HCL (PF) 2 % IJ SOLN
INTRAMUSCULAR | Status: DC | PRN
  Administered 2017-08-29: 80 mg

## 2017-08-29 MED ORDER — SODIUM CHLORIDE 0.9 % IV SOLN: INTRAVENOUS | Status: DC

## 2017-08-29 MED ORDER — FENTANYL CITRATE (PF) 100 MCG/2ML IJ SOLN
INTRAMUSCULAR | Status: AC
  Filled 2017-08-29: qty 2

## 2017-08-29 MED ORDER — FENTANYL CITRATE (PF) 100 MCG/2ML IJ SOLN
INTRAMUSCULAR | Status: DC | PRN
  Administered 2017-08-29 (×2): 50 ug via INTRAVENOUS

## 2017-08-29 MED ORDER — SODIUM CHLORIDE 0.9 % IV SOLN
INTRAVENOUS | Status: DC
  Administered 2017-08-29: 08:00:00 via INTRAVENOUS

## 2017-08-29 MED ORDER — MIDAZOLAM HCL 5 MG/5ML IJ SOLN
INTRAMUSCULAR | Status: DC | PRN
  Administered 2017-08-29: 2 mg via INTRAVENOUS

## 2017-08-29 MED ORDER — PROPOFOL 500 MG/50ML IV EMUL
INTRAVENOUS | Status: DC | PRN
  Administered 2017-08-29: 75 ug/kg/min via INTRAVENOUS

## 2017-08-29 MED ORDER — MIDAZOLAM HCL 2 MG/2ML IJ SOLN
INTRAMUSCULAR | Status: AC
  Filled 2017-08-29: qty 2

## 2017-08-29 NOTE — H&P (Signed)
Primary Care Physician:  Rogelia Rohrer, MD Primary Gastroenterologist:  Dr. Vira Agar  Pre-Procedure History & Physical: HPI:  Jeremiah [name] is a 72 y.o. male is here for an colonoscopy.  Done for [place] colon polyps and FH colon cancer.   Past Medical History:  Diagnosis Date  . Chest pain    a. 10/2014 Neg MV @ [facility].  . Colon polyps   . Hydrocele    a. 10/2013 s/p L epididymectomy and hyrocele repair.  Marland Kitchen Hyperlipidemia   . Hypertension   . Leukopenia   . Myocardial infarction ([place])   . Sinus bradycardia     Past Surgical History:  Procedure Laterality Date  . CARDIAC CATHETERIZATION    . CORONARY STENT INTERVENTION N/A 07/18/2017   Procedure: CORONARY STENT INTERVENTION;  Surgeon: Wellington Hampshire, MD;  Location: [place] [facility];  Service: Cardiovascular;  Laterality: N/A;  . EPIDIDYMECTOMY UNILATERAL    . HYDROCELE EXCISION / REPAIR Left 11/13/2013  . LEFT HEART CATH AND CORONARY ANGIOGRAPHY N/A 07/18/2017   Procedure: LEFT HEART CATH AND CORONARY ANGIOGRAPHY;  Surgeon: Wellington Hampshire, MD;  Location: [place] [facility];  Service: Cardiovascular;  Laterality: N/A;    Prior to Admission medications   Medication Sig Start Date End Date Taking? Authorizing Provider  aspirin EC 81 MG tablet Take 81 mg by mouth daily.   Yes [provider]  atorvastatin (LIPITOR) 40 MG tablet Take 1 tablet (40 mg total) by mouth daily. 07/29/17 10/27/17 Yes Wellington Hampshire, MD  cetirizine (ZYRTEC) 10 MG tablet Take 10 mg by mouth daily.   Yes [provider]  cholecalciferol (VITAMIN D) 1000 units tablet Take 1,000 Units by mouth daily.   Yes [provider]  metoprolol succinate (TOPROL-XL) 25 MG 24 hr tablet Take 1 tablet (25 mg total) by mouth daily. 07/29/17  Yes Wellington Hampshire, MD  Omega-3 Fatty Acids (FISH OIL) 1000 MG CAPS Take 1,000 mg by mouth daily.    Yes [provider]  vitamin B-12 (CYANOCOBALAMIN) 1000 MCG tablet Take 1,000 mcg by mouth daily.    Yes [provider]  nitroGLYCERIN (NITROSTAT) 0.4 MG SL tablet Place 1 tablet (0.4 mg total) under the tongue every 5 (five) minutes x 3 doses as needed for chest pain. 07/21/17   Kathi Ludwig, MD    Allergies as of 08/10/2017  . (No Known Allergies)    Family History  Problem Relation Age of Onset  . Heart attack Father        age 23-  died w/ sepsis @ 73.  . Cancer - Colon Father   . COPD Mother        died @ 78    Social History   Socioeconomic History  . Marital status: Married    Spouse name: Not on file  . Number of children: Not on file  . Years of education: Not on file  . Highest education level: Not on file  Occupational History  . Not on file  Social Needs  . Financial resource strain: Not on file  . Food insecurity:    Worry: Not on file    Inability: Not on file  . Transportation needs:    Medical: Not on file    Non-medical: Not on file  Tobacco Use  . Smoking status: Never Smoker  . Smokeless tobacco: Never Used  Substance and Sexual Activity  . Alcohol use: Not Currently  . Drug use: Never  . Sexual activity:  Not on file  Lifestyle  . Physical activity:    Days per week: Not on file    Minutes per session: Not on file  . Stress: Not on file  Relationships  . Social connections:    Talks on phone: Not on file    Gets together: Not on file    Attends religious service: Not on file    Active member of club or organization: Not on file    Attends meetings of clubs or organizations: Not on file    Relationship status: Not on file  . Intimate partner violence:    Fear of current or ex partner: Not on file    Emotionally abused: Not on file    Physically abused: Not on file    Forced sexual activity: Not on file  Other Topics Concern  . Not on file  Social History Narrative   Lives in [place] with wife.    Review of Systems: See HPI, otherwise negative ROS  Physical Exam: BP 121/76   Pulse 66   Temp (!) 97.5 F (36.4 C)  (Tympanic)   Resp 20   Ht 5\' 9"  (1.753 m)   Wt 86.2 kg (190 lb)   SpO2 100%   BMI 28.06 kg/m  General:   Alert,  pleasant and cooperative in NAD Head:  Normocephalic and atraumatic. Neck:  Supple; no masses or thyromegaly. Lungs:  Clear throughout to auscultation.    Heart:  Regular rate and rhythm. Abdomen:  Soft, nontender and nondistended. Normal bowel sounds, without guarding, and without rebound.   Neurologic:  Alert and  oriented x4;  grossly normal neurologically.  Impression/Plan: Jeremiah [name] is here for an colonoscopy to be performed for FH colon cancer and PH colon polyps.  Risks, benefits, limitations, and alternatives regarding  colonoscopy have been reviewed with the patient.  Questions have been answered.  All parties agreeable.   Gaylyn Cheers, MD  08/29/2017, 8:25 AM

## 2017-08-29 NOTE — Anesthesia Preprocedure Evaluation (Signed)
Anesthesia Evaluation  Patient identified by MRN, date of birth, ID band Patient awake    Reviewed: Allergy & Precautions, H&P , NPO status , Patient's Chart, lab work & pertinent test results  History of Anesthesia Complications Negative for: history of anesthetic complications  Airway Mallampati: III  TM Distance: <3 FB Neck ROM: limited    Dental  (+) Chipped, Poor Dentition   Pulmonary neg shortness of breath, sleep apnea ,           Cardiovascular Exercise Tolerance: Good hypertension, (-) angina+ Past MI and + Cardiac Stents  (-) DOE      Neuro/Psych negative neurological ROS  negative psych ROS   GI/Hepatic negative GI ROS, Neg liver ROS, neg GERD  ,  Endo/Other  negative endocrine ROS  Renal/GU negative Renal ROS  negative genitourinary   Musculoskeletal   Abdominal   Peds  Hematology negative hematology ROS (+)   Anesthesia Other Findings Past Medical History: No date: Chest pain     Comment:  a. 10/2014 Neg MV @ [facility]. No date: Colon polyps No date: Hydrocele     Comment:  a. 10/2013 s/p L epididymectomy and hyrocele repair. No date: Hyperlipidemia No date: Hypertension No date: Leukopenia No date: Myocardial infarction [facility]) No date: Sinus bradycardia  Past Surgical History: No date: CARDIAC CATHETERIZATION 07/18/2017: CORONARY STENT INTERVENTION; N/A     Comment:  Procedure: CORONARY STENT INTERVENTION;  Surgeon: Wellington Hampshire, MD;  Location: [place] [facility];                Service: Cardiovascular;  Laterality: N/A; No date: EPIDIDYMECTOMY UNILATERAL 11/13/2013: HYDROCELE EXCISION / REPAIR; Left 07/18/2017: LEFT HEART CATH AND CORONARY ANGIOGRAPHY; N/A     Comment:  Procedure: LEFT HEART CATH AND CORONARY ANGIOGRAPHY;                Surgeon: Wellington Hampshire, MD;  Location: [place]               CV LAB;  Service: Cardiovascular;  Laterality: N/A;     Reproductive/Obstetrics negative OB ROS                             Anesthesia Physical Anesthesia Plan  ASA: III  Anesthesia Plan: General   Post-op Pain Management:    Induction: Intravenous  PONV Risk Score and Plan: Propofol infusion and TIVA  Airway Management Planned: Natural Airway and Nasal Cannula  Additional Equipment:   Intra-op Plan:   Post-operative Plan:   Informed Consent: I have reviewed the patients History and Physical, chart, labs and discussed the procedure including the risks, benefits and alternatives for the proposed anesthesia with the patient or authorized representative who has indicated his/her understanding and acceptance.   Dental Advisory Given  Plan Discussed with: Anesthesiologist, CRNA and Surgeon  Anesthesia Plan Comments: (Patient consented for risks of anesthesia including but not limited to:  - adverse reactions to medications - risk of intubation if required - damage to teeth, lips or other oral mucosa - sore throat or hoarseness - Damage to heart, brain, lungs or loss of life  Patient voiced understanding.)        Anesthesia Quick Evaluation

## 2017-08-29 NOTE — Transfer of Care (Signed)
Immediate Anesthesia Transfer of Care Note  Patient: Jeremiah [name]  Procedure(s) Performed: COLONOSCOPY WITH PROPOFOL (N/A )  Patient Location: PACU  Anesthesia Type:General  Level of Consciousness: sedated  Airway & Oxygen Therapy: Patient Spontanous Breathing and Patient connected to nasal cannula oxygen  Post-op Assessment: Report given to RN and Post -op Vital signs reviewed and stable  Post vital signs: Reviewed and stable  Last Vitals:  Vitals Value Taken Time  BP 96/68 08/29/2017  9:02 AM  Temp 36.1 C 08/29/2017  9:02 AM  Pulse 56 08/29/2017  9:03 AM  Resp 14 08/29/2017  9:03 AM  SpO2 97 % 08/29/2017  9:03 AM  Vitals shown include unvalidated device data.  Last Pain:  Vitals:   08/29/17 0902  TempSrc: Tympanic  PainSc: 0-No pain         Complications: No apparent anesthesia complications

## 2017-08-29 NOTE — Anesthesia Postprocedure Evaluation (Signed)
Anesthesia Post Note  Patient: Jeremiah [name]  Procedure(s) Performed: COLONOSCOPY WITH PROPOFOL (N/A )  Patient location during evaluation: Endoscopy Anesthesia Type: General Level of consciousness: awake and alert Pain management: pain level controlled Vital Signs Assessment: post-procedure vital signs reviewed and stable Respiratory status: spontaneous breathing, nonlabored ventilation, respiratory function stable and patient connected to nasal cannula oxygen Cardiovascular status: blood pressure returned to baseline and stable Postop Assessment: no apparent nausea or vomiting Anesthetic complications: no     Last Vitals:  Vitals:   08/29/17 0902 08/29/17 0912  BP: 96/68 104/61  Pulse:    Resp: 15   Temp: (!) 36.1 C   SpO2: 97%     Last Pain:  Vitals:   08/29/17 0942  TempSrc:   PainSc: 0-No pain                 Precious Haws Piscitello

## 2017-08-29 NOTE — Anesthesia Post-op Follow-up Note (Signed)
Anesthesia QCDR form completed.        

## 2017-08-29 NOTE — Op Note (Signed)
[facility]astroenterology Patient Name: Jeremiah [name] Procedure Date: 08/29/2017 8:20 AM MRN: [id number] Account #: [id number] Date of Birth: 07/26/1945 Admit Type: Outpatient Age: 72 Room: [facility] ENDO ROOM 1 Gender: Male Note Status: Finalized Procedure:            Colonoscopy Indications:          Screening in patient at increased risk: Family history                        of 1st-degree relative with colorectal cancer Providers:            Manya Silvas, MD Referring MD:         Merwyn Katos, MD (Referring MD) Medicines:            Propofol per Anesthesia Complications:        No immediate complications. Procedure:            Pre-Anesthesia Assessment:                       - After reviewing the risks and benefits, the patient                        was deemed in satisfactory condition to undergo the                        procedure.                       After obtaining informed consent, the colonoscope was                        passed under direct vision. Throughout the procedure,                        the patient's blood pressure, pulse, and oxygen                        saturations were monitored continuously. The                        Colonoscope was introduced through the anus and                        advanced to the the cecum, identified by appendiceal                        orifice and ileocecal valve. The colonoscopy was                        performed without difficulty. The patient tolerated the                        procedure well. The quality of the bowel preparation                        was excellent. Findings:      A small polyp was found in the ascending colon. The polyp was sessile.       The polyp was removed with a cold snare. Also cold biopsy. Resection and       retrieval were complete.  A diminutive polyp was found in the cecum. The polyp was sessile. The       polyp was removed with a jumbo cold forceps. Resection and  retrieval       were complete.      A diminutive polyp was found in the transverse colon. The polyp was       sessile. The polyp was removed with a jumbo cold forceps. Resection and       retrieval were complete.      A diminutive polyp was found in the descending colon. The polyp was       sessile. The polyp was removed with a jumbo cold forceps. Resection and       retrieval were complete.      Internal hemorrhoids were found during endoscopy. The hemorrhoids were       small and Grade I (internal hemorrhoids that do not prolapse). Impression:           - One small polyp in the ascending colon, removed with                        a cold snare. Resected and retrieved.                       - One diminutive polyp in the cecum, removed with a                        jumbo cold forceps. Resected and retrieved.                       - One diminutive polyp in the transverse colon, removed                        with a jumbo cold forceps. Resected and retrieved.                       - One diminutive polyp in the descending colon, removed                        with a jumbo cold forceps. Resected and retrieved.                       - Internal hemorrhoids. Recommendation:       - Await pathology results. Manya Silvas, MD 08/29/2017 9:02:25 AM This report has been signed electronically. Number of Addenda: 0 Note Initiated On: 08/29/2017 8:20 AM Scope Withdrawal Time: 0 hours 9 minutes 1 second  Total Procedure Duration: 0 hours 25 minutes 58 seconds       [facility]

## 2017-08-30 ENCOUNTER — Encounter: Payer: Self-pay | Admitting: Unknown Physician Specialty

## 2017-08-30 LAB — SURGICAL PATHOLOGY

## 2017-09-30 ENCOUNTER — Ambulatory Visit: Payer: Medicare HMO | Admitting: Cardiovascular Disease

## 2018-11-24 ENCOUNTER — Other Ambulatory Visit: Payer: Self-pay | Admitting: Cardiovascular Disease

## 2020-10-22 ENCOUNTER — Ambulatory Visit
Admission: EM | Admit: 2020-10-22 | Discharge: 2020-10-22 | Disposition: A | Discharge status: Home / Self Care | Payer: Medicare HMO | Attending: Family Medicine | Admitting: Family Medicine

## 2020-10-22 ENCOUNTER — Ambulatory Visit (INDEPENDENT_AMBULATORY_CARE_PROVIDER_SITE_OTHER): Payer: Medicare HMO

## 2020-10-22 ENCOUNTER — Other Ambulatory Visit: Payer: Self-pay

## 2020-10-22 DIAGNOSIS — W19XXXA Unspecified fall, initial encounter: Secondary | ICD-10-CM | POA: Diagnosis not present

## 2020-10-22 DIAGNOSIS — S82831A Other fracture of upper and lower end of right fibula, initial encounter for closed fracture: Secondary | ICD-10-CM | POA: Diagnosis not present

## 2020-10-22 DIAGNOSIS — M25571 Pain in right ankle and joints of right foot: Secondary | ICD-10-CM

## 2020-10-22 DIAGNOSIS — S93401A Sprain of unspecified ligament of right ankle, initial encounter: Secondary | ICD-10-CM | POA: Diagnosis not present

## 2020-10-22 NOTE — Discharge Instructions (Addendum)
FRACTURE LOWER EXTREMITY: You have had imaging today which indicated a fracture of your fibula. You will need to follow up with orthopedics. Contact one of the clinics below. Wear the walking boot. You have been advised not to use the affected toe/ankle/foot/ knee/leg until cleared by orthopedics. You should wear the brace/splint that was applied today. Practice RICE guidelines and take NSAIDs and/or Tylenol for pain relief   If you need a copy of the x-ray please contact the radiology dept at [facility] and pick it up there.  You have a condition requiring you to follow up with Orthopedics so please call one of the following office for appointment:   [facility] [address] [place], [place] [address] Phone: [phone]  [facility] [address] [place], [place] [address] Phone: [phone]

## 2020-10-22 NOTE — ED Provider Notes (Signed)
[facility]    CSN: [id number] Arrival date & time: 10/22/20  1406      History   Chief Complaint Chief Complaint  Patient presents with   Fall    10/21/20   Ankle Pain    right    HPI Jeremiah [name] is a 75 y.o. male presenting for right ankle pain since yesterday.  Patient states that he was doing some work outside and accidentally slipped in some mud and inverted his right ankle.  He has swelling and bruising to the ankle.  Admits to increased pain with weightbearing.  He also has some decreased range of motion to the ankle.  Patient has history of some peripheral neuropathy and admits to some tingling which is not unusual.  Patient also admits to history of multiple surgeries on both the right and left feet due to a crush injury over 30 years ago.  He has applied ice to his foot but not taking anything for pain relief.  He says that his pain is not so bad when he is not bearing weight on his foot.  No other injuries sustained in the fall.  He denies head injury or loss of consciousness.  HPI  Past Medical History:  Diagnosis Date   Chest pain    a. 10/2014 Neg MV @ [facility].   Colon polyps    Hydrocele    a. 10/2013 s/p L epididymectomy and hyrocele repair.   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   NSTEMI (non-ST elevated myocardial infarction) ([place]) 07/15/2017    Past Surgical History:  Procedure Laterality Date   CARDIAC CATHETERIZATION     COLONOSCOPY WITH PROPOFOL N/A 08/29/2017   Procedure: COLONOSCOPY WITH PROPOFOL;  Surgeon: Manya Silvas, MD;  Location: [facility] ENDOSCOPY;  Service: Endoscopy;  Laterality: N/A;   CORONARY STENT INTERVENTION N/A 07/18/2017   Procedure: CORONARY STENT INTERVENTION;  Surgeon: Wellington Hampshire, MD;  Location: [facility] CV LAB;  Service: Cardiovascular;   Laterality: N/A;   EPIDIDYMECTOMY UNILATERAL     HYDROCELE EXCISION / REPAIR Left 11/13/2013   LEFT HEART CATH AND CORONARY ANGIOGRAPHY N/A 07/18/2017   Procedure: LEFT HEART CATH AND CORONARY ANGIOGRAPHY;  Surgeon: Wellington Hampshire, MD;  Location: [facility];  Service: Cardiovascular;  Laterality: N/A;       Home Medications    Prior to Admission medications   Medication Sig Start Date End Date Taking? Authorizing Provider  aspirin 81 MG chewable tablet Chew by mouth. 06/22/10  Yes [provider]  atorvastatin (LIPITOR) 40 MG tablet TAKE 1 TABLET DAILY 11/27/18  Yes Wellington Hampshire, MD  cetirizine (ZYRTEC) 10 MG tablet Take 10 mg by mouth daily.   Yes [provider]  cholecalciferol (VITAMIN D) 1000 units tablet Take 1,000 Units by mouth daily.   Yes [provider]  metoprolol succinate (TOPROL-XL) 25 MG 24 hr tablet Take 1 tablet (25 mg total) by mouth daily. 07/29/17  Yes Wellington Hampshire, MD  nitroGLYCERIN (NITROSTAT) 0.4 MG SL tablet Place 1 tablet (0.4 mg total) under the tongue every 5 (five) minutes x 3 doses as needed for chest pain. 07/21/17  Yes Kathi Ludwig, MD  Omega-3 Fatty Acids (FISH OIL) 1000 MG CAPS Take 1,000 mg by mouth daily.    Yes  [provider]  tamsulosin (FLOMAX) 0.4 MG CAPS capsule Take 0.4 mg by mouth daily. 08/21/20  Yes [provider]  vitamin B-12 (CYANOCOBALAMIN) 1000 MCG tablet Take 1,000 mcg by mouth daily.   Yes [provider]    Family History Family History  Problem Relation Age of Onset   Heart attack Father        age 107-  died w/ sepsis @ 22.   Cancer - Colon Father    COPD Mother        died @ 54    Social History Social History   Tobacco Use   Smoking status: Never   Smokeless tobacco: Never  Vaping Use   Vaping Use: Never used  Substance Use Topics   Alcohol use: Not Currently   Drug use: Never     Allergies   Short ragweed pollen ext   Review of  Systems Review of Systems  Musculoskeletal:  Positive for arthralgias, gait problem and joint swelling.  Skin:  Positive for color change. Negative for wound.  Neurological:  Negative for weakness and numbness.    Physical Exam Triage Vital Signs ED Triage Vitals  Enc Vitals Group     BP 10/22/20 1529 (!) 143/86     Pulse Rate 10/22/20 1529 (!) 53     Resp 10/22/20 1529 18     Temp 10/22/20 1529 97.9 F (36.6 C)     Temp Source 10/22/20 1529 Oral     SpO2 10/22/20 1529 100 %     Weight 10/22/20 1525 212 lb (96.2 kg)     Height 10/22/20 1525 5\' 9"  (1.753 m)     Head Circumference --      Peak Flow --      Pain Score 10/22/20 1524 5     Pain Loc --      Pain Edu? --      Excl. in [place]? --    No data found.  Updated Vital Signs BP (!) 143/86 (BP Location: Left Arm)   Pulse (!) 53   Temp 97.9 F (36.6 C) (Oral)   Resp 18   Ht 5\' 9"  (1.753 m)   Wt 212 lb (96.2 kg)   SpO2 100%   BMI 31.31 kg/m       Physical Exam Vitals and nursing note reviewed.  Constitutional:      General: He is not in acute distress.    Appearance: Normal appearance. He is well-developed. He is not ill-appearing.  HENT:     Head: Normocephalic and atraumatic.  Eyes:     General: No scleral icterus.    Conjunctiva/sclera: Conjunctivae normal.  Cardiovascular:     Rate and Rhythm: Regular rhythm. Bradycardia present.     Pulses: Normal pulses.  Pulmonary:     Effort: Pulmonary effort is normal. No respiratory distress.     Breath sounds: Normal breath sounds.  Musculoskeletal:     Cervical back: Neck supple.     Right ankle: Swelling (moderate swelling) and ecchymosis (mild ecchymosis lateral ankle) present. Tenderness present over the lateral malleolus and ATF ligament. Decreased range of motion. Normal pulse.  Skin:    General: Skin is warm and dry.  Neurological:     General: No focal deficit present.     Mental Status: He is alert. Mental status is at baseline.     Motor: No weakness.      Gait: Gait abnormal (slow and shuffling).  Psychiatric:  Mood and Affect: Mood normal.        Behavior: Behavior normal.        Thought Content: Thought content normal.     UC Treatments / Results  Labs (all labs ordered are listed, but only abnormal results are displayed) Labs Reviewed - No data to display  EKG   Radiology DG Ankle Complete Right  Result Date: 10/22/2020 CLINICAL DATA:  75 year old male with fall and right ankle pain. EXAM: RIGHT ANKLE - COMPLETE 3+ VIEW COMPARISON:  None. FINDINGS: There is a minimally displaced oblique fracture of the distal fibula. No other acute fracture. There is apparent minimal lateral subluxation of the ankle mortise. There is soft tissue swelling of the ankle. No radiopaque foreign object or soft tissue gas. IMPRESSION: 1. Minimally displaced oblique fracture of the distal fibula. 2. Apparent minimal lateral subluxation of the ankle mortise. Electronically Signed   By: Anner Crete M.D.   On: 10/22/2020 16:11    Procedures Procedures (including critical care time)  Medications Ordered in UC Medications - No data to display  Initial Impression / Assessment and Plan / UC Course  I have reviewed the triage vital signs and the nursing notes.  Pertinent labs & imaging results that were available during my care of the patient were reviewed by me and considered in my medical decision making (see chart for details).  75 year old male presenting for accidental slip and fall yesterday.  Has right ankle pain, swelling and bruising.  X-ray obtained today does show minimally displaced oblique fracture of the distal fibula.  There is also apparent minimal subluxation of the ankle mortise.  This could be potentially complicated fracture possibly requiring surgery.  Reviewed this result with patient and wife.  Patient placed in cam walking boot and advised to follow-up with Ortho.  He says that he plans to follow-up with [facility] clinic  since he has many providers through [facility].  Encouraged him to follow RICE guidelines and take Tylenol for pain.  I did offer him something stronger for pain but he declines.  Advised to follow-up with Ortho, otherwise return to our department as needed.  ED precautions reviewed.  Final Clinical Impressions(s) / UC Diagnoses   Final diagnoses:  Other closed fracture of distal end of right fibula, initial encounter  Sprain of right ankle, unspecified ligament, initial encounter  Acute right ankle pain     Discharge Instructions      FRACTURE LOWER EXTREMITY: You have had imaging today which indicated a fracture of your fibula. You will need to follow up with orthopedics. Contact one of the clinics below. Wear the walking boot. You have been advised not to use the affected toe/ankle/foot/ knee/leg until cleared by orthopedics. You should wear the brace/splint that was applied today. Practice RICE guidelines and take NSAIDs and/or Tylenol for pain relief   If you need a copy of the x-ray please contact the radiology dept at [facility] and pick it up there.  You have a condition requiring you to follow up with Orthopedics so please call one of the following office for appointment:   [facility] [address] [place], [place] [address] Phone: [phone]  [facility] [address] [place], [place] [address] Phone: [phone]      ED Prescriptions   None    PDMP not reviewed this encounter.   Danton Clap, PA-C 10/22/20 1650

## 2020-10-22 NOTE — ED Triage Notes (Signed)
Pt c/o fall last night and states he twisted his right ankle. Pt does have swelling and bruising to the ankle. Pt does have decreased ROM to the ankle, full sensation and ROM to the toes.

## 2020-10-29 ENCOUNTER — Other Ambulatory Visit: Payer: Self-pay | Admitting: Podiatry

## 2020-10-30 ENCOUNTER — Other Ambulatory Visit: Payer: Self-pay

## 2020-10-30 ENCOUNTER — Encounter: Payer: Self-pay | Admitting: Podiatry

## 2020-10-30 ENCOUNTER — Encounter
Admission: RE | Admit: 2020-10-30 | Discharge: 2020-10-30 | Disposition: A | Discharge status: Home / Self Care | Payer: Non-veteran care | Source: Ambulatory Visit | Attending: Podiatry | Admitting: Podiatry

## 2020-10-30 HISTORY — DX: Personal history of urinary calculi: Z87.442

## 2020-10-30 HISTORY — DX: Sleep apnea, unspecified: G47.30

## 2020-10-30 HISTORY — DX: Unspecified osteoarthritis, unspecified site: M19.90

## 2020-10-30 HISTORY — DX: Atherosclerotic heart disease of native coronary artery without angina pectoris: I25.10

## 2020-10-30 MED ORDER — CHLORHEXIDINE GLUCONATE 0.12 % MT SOLN: 15.0000 mL | Freq: Once | OROMUCOSAL | Status: AC

## 2020-10-30 MED ORDER — POVIDONE-IODINE 7.5 % EX SOLN
Freq: Once | CUTANEOUS | Status: DC
  Filled 2020-10-30: qty 118

## 2020-10-30 MED ORDER — FAMOTIDINE 20 MG PO TABS: 20.0000 mg | ORAL_TABLET | Freq: Once | ORAL | Status: AC

## 2020-10-30 MED ORDER — ORAL CARE MOUTH RINSE
15.0000 mL | Freq: Once | OROMUCOSAL | Status: AC
Start: 2020-10-30 — End: 2020-10-31

## 2020-10-30 MED ORDER — LACTATED RINGERS IV SOLN: INTRAVENOUS | Status: DC

## 2020-10-30 MED ORDER — CEFAZOLIN SODIUM-DEXTROSE 2-4 GM/100ML-% IV SOLN
2.0000 g | INTRAVENOUS | Status: AC
  Administered 2020-10-31: 2 g via INTRAVENOUS

## 2020-10-30 NOTE — Progress Notes (Signed)
Perioperative Services  Pre-Admission/Anesthesia Testing Clinical Review  Date: 10/30/20  Patient Demographics:  Name: Jeremiah [name] DOB:   08/29/1945 MRN:   [id number]  Planned Surgical Procedure(s):    Case: [id number] Date/Time: 10/31/20 1545   Procedure: OPEN REDUCTION INTERNAL FIXATION (ORIF) ANKLE FRACTURE (Right: Ankle)   Anesthesia type: Choice   Pre-op diagnosis:      S82.6- Displaces fracture lateral malleolus     S93.431A- Syndesmotic disruption of ankle, right   Location: [facility] OR ROOM 03 / [place] [facility] FOR ANESTHESIA [facility]   Surgeons: Caroline More, DPM     NOTE: Available PAT nursing documentation and vital signs have been reviewed. Clinical nursing staff has updated patient's PMH/PSHx, current medication list, and drug allergies/intolerances to ensure comprehensive history available to assist in medical decision making as it pertains to the aforementioned surgical procedure and anticipated anesthetic course. Extensive review of available clinical information performed. Jeremiah [name] PMH and PSHx updated with any diagnoses/procedures that  may have been inadvertently omitted during his intake with the pre-admission testing department's nursing staff.  Clinical Discussion:  Jeremiah [name] is a 75 y.o. male who is submitted for pre-surgical anesthesia review and clearance prior to him undergoing the above procedure. Patient has never been a smoker. Pertinent PMH includes: NSTEMI, mild diastolic dysfunction (U2PN), angina, bradycardia, OSAH (no nocturnal PAP; gave machine away), HTN, HLD, leukopenia, essential tremor, nephrolithiasis.  Patient is followed by cardiology Jeremiah Rossetti, MD). He was last seen in the cardiology clinic on 03/28/2020; notes reviewed.  At the time of his clinic visit, patient doing well overall from a cardiovascular perspective.  He denied any chest pain, short of breath, PND, orthopnea, palpitations, significant peripheral edema, vertiginous symptoms, or  presyncope/syncope.  PMH significant for cardiovascular diagnoses.  Myocardial perfusion imaging study performed on 11/06/2014 revealed a normal left ventricular systolic function with EF >65%.  There was no evidence of stress-induced myocardial ischemia or arrhythmia.  Patient achieved 10.1 METS during study with no ST or T wave changes noted on ECG.  Patient presented to the [facility] ED on 07/15/2017.  Cardiac enzymes were elevated.  Patient diagnosed with NSTEMI and admitted to the hospital.    Diagnostic left heart catheterization performed on 07/18/2017 revealed severe single-vessel CAD with a CTO of the mid LAD.  There was noted flow from RCA collaterals.  Procedure complicated by a wire induced dissection into the septal branch that was treated with prolonged balloon inflation and anticoagulation reversal.    Patient was transferred to [facility] for ongoing management and consideration of further intervention.   TTE performed on 07/18/2017 revealed low normal left ventricular systolic function with an EF of 50-55%.  There was severe anteroseptal and inferoseptal hypokinesis.  There was no evidence of valvular insufficiency or stenosis.    Patient stabilized during admission to [facility] and was ultimately discharged home on 07/21/2017 with plans for cardiac rehab.  Long-term cardiac event monitor study performed on 07/27/2019 revealed a predominantly underlying sinus rhythm.  Maximum heart rate recorded was 136 bpm, minimum heart rate recorded was 39 bpm, and the average heart rate recorded was 56 bpm.  There was 1 occurrence of SVT lasting 3 beats (see full interpretation of cardiovascular testing below).  Blood pressure well controlled at 118/79 on currently prescribed beta-blocker monotherapy.  Patient has not required the use of his sublingual nitrate therapy for episodes of angina since last seen in the office.  Patient on a statin for his HLD.  Functional capacity,  as  defined by DASI, is documented as being >/= 4 METS.  No changes were made to patient's medication regimen.  Patient to continue regular outpatient follow-up with cardiology for ongoing management.  Patient is scheduled for an elective podiatric procedure on 10/31/2020 with Dr. Caroline More, DPM.  Given patient's past medical history significant for cardiovascular diagnoses, presurgical cardiac clearance was sought by the performing surgeon's office and PAT team. Per cardiology, "this patient is optimized for surgery and may proceed with the planned procedural course with a ACCEPTABLE risk stratification".  This patient is on daily antiplatelet therapy.  Patient to continue his daily low-dose ASA throughout the perioperative period.    Patient denies previous perioperative complications with anesthesia in the past. In review of the available records, it is noted that patient underwent a general anesthetic course at [facility] ([facility]) in 03/2018 without documented complications.   Vitals with BMI 10/22/2020 08/29/2017 08/29/2017  Height _0  - -  Weight 212 lbs - -  BMI 54.65 - -  Systolic 035 465 96  Diastolic 86 61 68  Pulse 53 - -    Providers/Specialists:   NOTE: Primary physician provider listed below. Patient may have been seen by APP or partner within same practice.   PROVIDER ROLE / SPECIALTY LAST Norva Riffle, DPM  Podiatry (Surgeon) 10/28/2020  Rogelia Rohrer, MD  Primary Care Provider 07/11/2020  Venetia Maxon, MD  Cardiology 03/28/2020   Allergies:  Short ragweed pollen ext  Current Home Medications:   No current facility-administered medications for this encounter.    aspirin 325 MG tablet   aspirin EC 81 MG tablet   atorvastatin (LIPITOR) 40 MG tablet   cetirizine (ZYRTEC) 10 MG tablet   cholecalciferol (VITAMIN D) 1000 units tablet   Cyanocobalamin 1500 MCG TBDP   metoprolol succinate (TOPROL-XL) 25 MG 24 hr tablet   nitroGLYCERIN (NITROSTAT) 0.4 MG SL  tablet   Omega-3 Fatty Acids (FISH OIL) 1000 MG CAPS   OVER THE COUNTER MEDICATION   tamsulosin (FLOMAX) 0.4 MG CAPS capsule   History:   Past Medical History:  Diagnosis Date   Arthritis    Chest pain    a. 10/2014 Neg MV @ [facility].   Colon polyps    Coronary artery disease    Essential tremor    Grade I diastolic dysfunction    History of kidney stones    Hydrocele    a. 10/2013 s/p L epididymectomy and hyrocele repair.   Hyperlipidemia    Hypertension    Leukopenia    NSTEMI (non-ST elevated myocardial infarction) ([place]) 07/15/2017   Sinus bradycardia    Sleep apnea    does not have cpap-gave it away   Past Surgical History:  Procedure Laterality Date   COLONOSCOPY WITH PROPOFOL N/A 08/29/2017   Procedure: COLONOSCOPY WITH PROPOFOL;  Surgeon: Manya Silvas, MD;  Location: [facility] ENDOSCOPY;  Service: Endoscopy;  Laterality: N/A;   CORONARY STENT INTERVENTION N/A 07/18/2017   Procedure: CORONARY STENT INTERVENTION;  Surgeon: Wellington Hampshire, MD;  Location: [place] CV LAB;  Service: Cardiovascular;  Laterality: N/A;   EPIDIDYMECTOMY UNILATERAL     FOOT SURGERY Bilateral    fractures-also had knee scope done at the same time   HYDROCELE EXCISION / REPAIR Left 11/13/2013   LEFT HEART CATH AND CORONARY ANGIOGRAPHY N/A 07/18/2017   Procedure: LEFT HEART CATH AND CORONARY ANGIOGRAPHY;  Surgeon: Wellington Hampshire, MD;  Location: [place] CV LAB;  Service: Cardiovascular;  Laterality: N/A;   Family History  Problem Relation Age of Onset   Heart attack Father        age 79-  died w/ sepsis @ 6.   Cancer - Colon Father    COPD Mother        died @ 58   Social History   Tobacco Use   Smoking status: Never   Smokeless tobacco: Never  Vaping Use   Vaping Use: Never used  Substance Use Topics   Alcohol use: Not Currently   Drug use: Never    Pertinent Clinical Results:  LABS: Labs reviewed: Acceptable for surgery.   Ref Range & Units 4 mo ago  WBC 3.6 -  11.2 10*9/L 4.3   RBC 4.26 - 5.60 10*12/L 4.56   HGB 12.9 - 16.5 g/dL 15.2   HCT 39.0 - 48.0 % 43.6   MCV 77.6 - 95.7 fL 95.7   MCH 25.9 - 32.4 pg 33.2 High    MCHC 32.0 - 36.0 g/dL 34.7   RDW 12.2 - 15.2 % 13.2   MPV 6.8 - 10.7 fL 9.4   Platelet 150 - 450 10*9/L 268   Neutrophils % % 64.2   Lymphocytes % % 25.5   Monocytes % % 6.3   Eosinophils % % 3.2   Basophils % % 0.8   Absolute Neutrophils 1.8 - 7.8 10*9/L 2.7   Absolute Lymphocytes 1.1 - 3.6 10*9/L 1.1   Absolute Monocytes 0.3 - 0.8 10*9/L 0.3   Absolute Eosinophils 0.0 - 0.5 10*9/L 0.1   Absolute Basophils 0.0 - 0.1 10*9/L 0.0   Resulting Agency  [facility] LAB[facility]ATOR[facility]  Specimen Collected: 06/26/20 16:23 Last Resulted: 06/26/20 21:49  Received From: [place]  Result Received: 10/22/20 14:06    Ref Range & Units 4 mo ago  Sodium 135 - 145 mmol/L 139   Potassium 3.5 - 5.1 mmol/L 4.3   Chloride 98 - 107 mmol/L 105   Anion Gap 5 - 14 mmol/L 8   CO2 20.0 - 31.0 mmol/L 26.0   BUN 9 - 23 mg/dL 15   Creatinine 0.60 - 1.10 mg/dL 0.92   BUN/Creatinine Ratio  16   EGFR CKD-EPI Non-African American, Male >=60 mL/min/1.33m 81   EGFR CKD-EPI African American, Male >=60 mL/min/1.775m>90   Glucose 70 - 179 mg/dL 107   Calcium 8.7 - 10.4 mg/dL 9.5   Albumin 3.4 - 5.0 g/dL 4.0   Total Protein 5.7 - 8.2 g/dL 6.8   Total Bilirubin 0.3 - 1.2 mg/dL 0.6   AST <=34 U/L 20   ALT 10 - 49 U/L 18   Alkaline Phosphatase 46 - 116 U/L 71   [facility]  Specimen Collected: 06/26/20 16:23 Last Resulted: 06/26/20 23:27  [facility]: 10/22/20 14:06    ECG: Date: 03/28/2020 Rate: 50 bpm Rhythm: sinus bradycardia Intervals: PR 164 ms. QRS 86 ms. QTc 393 ms. ST segment and T wave changes: No evidence of acute ST segment elevation or depression Comparison: Similar to previous tracing obtained on 07/27/2019 NOTE: [facility]  for review. Above based on cardiologist's interpretation.   IMAGING / PROCEDURES: LONG TERM CARDIAC EVENT MONITOR STUDY performed in 07/27/2019 Predominantly underlying normal sinus rhythm Maximum heart rate 136 bpm Minimum heart rate 39 bpm Average heart rate 56 bpm PACs x 80 with a burden of 0.01% 1 occurrence of SVT with the longest episode lasting 3  beats; rate 131 bpm.  TRANSTHORACIC ECHOCARDIOGRAM performed on 07/19/2017 Left ventricular systolic function normal with an EF in the range of 55-60% Left ventricular cavity size normal, however there was mild focal basal hypertrophy of the septum Mid anteroseptal myocardial akinesis Doppler parameters consistent with abnormal left ventricular relaxation (G1DD) Trivial aortic valve regurgitation  LEFT HEART CATHETERIZATION AND CORONARY ANGIOGRAPHY performed on 07/18/2017 Severe single-vessel CAD with collaterals from the RCA 30% stenosis of the ostial to proximal RCA 30% stenosis of the mid RCA 60% stenosis proximal LAD  100% stenosis of the mid LAD Unsuccessful PCI and stent placement to the mid LAD due to inability to establish flow but also a complication of a wire induced dissection into the septal branch which was treated with prolonged balloon inflation and reversal of anticoagulation. Echocardiogram showed no significant pericardial effusion. Patient transferred to [facility] for further evaluation and possible intervention.   LEXISCAN performed on 11/06/2014 LVEF >65% Globally normal systolic function No evidence of stress-induced myocardial ischemia or arrhythmia Exercised 9 minutes and 2 seconds achieving stage IV on Bruce protocol (10.1 METS)  Impression and Plan:  Jeremiah [name] has been referred for pre-anesthesia review and clearance prior to him undergoing the planned anesthetic and procedural courses. Available labs, pertinent testing, and imaging results were personally reviewed by me. This patient has  been appropriately cleared by cardiology with an overall ACCEPTABLE risk of significant perioperative cardiovascular complications.  Due to the timing of his procedure, patient did not come through PAT for preoperative labs and ECG.  Given his cardiac history and length of time since his last ECG tracing was performed.  Preoperative ECG will be required.  Order entered for patient to have this performed in SDS prior to his procedure.  Tracing will need to be reviewed by anesthesia team prior to proceeding.  With that being said, based on clinical review performed today (10/30/20), barring any significant acute changes in the patient's overall condition, it is anticipated that he will be able to proceed with the planned surgical intervention. Any acute changes in clinical condition may necessitate his procedure being postponed and/or cancelled. Patient will meet with anesthesia team (MD and/or CRNA) on the day of his procedure for preoperative evaluation/assessment. Questions regarding anesthetic course will be fielded at that time.   Pre-surgical instructions were reviewed with the patient during his PAT appointment and questions were fielded by PAT clinical staff. Patient was advised that if any questions or concerns arise prior to his procedure then he should return a call to PAT and/or his surgeon's office to discuss.  Honor Loh, MSN, APRN, FNP-C, CEN [employer]  Peri-operative Services Nurse Practitioner Phone: [phone] Fax: [phone] 10/30/20 12:45 PM  NOTE: This note has been prepared using Dragon dictation software. Despite my best ability to proofread, there is always the potential that unintentional transcriptional errors may still occur from this process.

## 2020-10-30 NOTE — Patient Instructions (Addendum)
Your procedure is scheduled on:10-31-20 Friday Report to the Registration Desk on the 1st floor of the [place]. To find out your arrival time, please call [phone] between 1PM - 3PM on:10-30-20 Thursday  REMEMBER: Instructions that are not followed completely may result in serious medical risk, up to and including death; or upon the discretion of your surgeon and anesthesiologist your surgery may need to be rescheduled.  Do not eat food after midnight the night before surgery.  No gum chewing, lozengers or hard candies.  You may however, drink CLEAR liquids up to 2 hours before you are scheduled to arrive for your surgery. Do not drink anything within 2 hours of your scheduled arrival time.  Clear liquids include: - water  - apple juice without pulp - gatorade - black coffee or tea (Do NOT add milk or creamers to the coffee or tea) Do NOT drink anything that is not on this list.  TAKE THESE MEDICATIONS THE MORNING OF SURGERY WITH A SIP OF WATER: -Metoprolol -Flomax -Lipitor  Continue your 81 mg Aspirin-Do not take Aspirin the day of surgery  One week prior to surgery: Stop Anti-inflammatories (NSAIDS) such as Advil, Aleve, Ibuprofen, Motrin, Naproxen, Naprosyn and Aspirin based products such as Excedrin, Goodys Powder, BC Powder.You may however take Tylenol if needed for pain up until the day of surgery. Stop ANY OVER THE COUNTER supplements until after surgery-last dose of vitamins and supplements was today 10-30-20   No Alcohol for 24 hours before or after surgery.  No Smoking including e-cigarettes for 24 hours prior to surgery.  No chewable tobacco products for at least 6 hours prior to surgery.  No nicotine patches on the day of surgery.  Do not use any "recreational" drugs for at least a week prior to your surgery.  Please be advised that the combination of cocaine and anesthesia may have negative outcomes, up to and including death. If you test positive for cocaine,  your surgery will be cancelled.  On the morning of surgery brush your teeth with toothpaste and water, you may rinse your mouth with mouthwash if you wish. Do not swallow any toothpaste or mouthwash.  Do not wear jewelry, make-up, hairpins, clips or nail polish.  Do not wear lotions, powders, or perfumes.   Do not shave body from the neck down 48 hours prior to surgery just in case you cut yourself which could leave a site for infection.  Also, freshly shaved skin may become irritated if using the CHG soap.  Contact lenses, hearing aids and dentures may not be worn into surgery.  Do not bring valuables to the hospital. [facility] is not responsible for any missing/lost belongings or valuables.   Notify your doctor if there is any change in your medical condition (cold, fever, infection).  Wear comfortable clothing (specific to your surgery type) to the hospital.  After surgery, you can help prevent lung complications by doing breathing exercises.  Take deep breaths and cough every 1-2 hours. Your doctor may order a device called an Incentive Spirometer to help you take deep breaths. When coughing or sneezing, hold a pillow firmly against your incision with both hands. This is called "splinting." Doing this helps protect your incision. It also decreases belly discomfort.  If you are being admitted to the hospital overnight, leave your suitcase in the car. After surgery it may be brought to your room.  If you are being discharged the day of surgery, you will not be allowed to  drive home. You will need a responsible adult (18 years or older) to drive you home and stay with you that night.   If you are taking public transportation, you will need to have a responsible adult (18 years or older) with you. Please confirm with your physician that it is acceptable to use public transportation.   Please call the [place] Dept. at [phone] if you have any questions about  these instructions.  Surgery Visitation Policy:  Patients undergoing a surgery or procedure may have one family member or support person with them as long as that person is not COVID-19 positive or experiencing its symptoms.  That person may remain in the waiting area during the procedure.  Inpatient Visitation:    Visiting hours are 7 a.m. to 8 p.m. Inpatients will be allowed two visitors daily. The visitors may change each day during the patient's stay. No visitors under the age of 25. Any visitor under the age of 49 must be accompanied by an adult. The visitor must pass COVID-19 screenings, use hand sanitizer when entering and exiting the patient's room and wear a mask at all times, including in the patient's room. Patients must also wear a mask when staff or their visitor are in the room. Masking is required regardless of vaccination status.

## 2020-10-31 ENCOUNTER — Ambulatory Visit: Payer: Medicare HMO | Admitting: Urgent Care

## 2020-10-31 ENCOUNTER — Ambulatory Visit
Admission: RE | Admit: 2020-10-31 | Discharge: 2020-10-31 | Disposition: A | Discharge status: Home / Self Care | Payer: Medicare HMO | Attending: Podiatry | Admitting: Podiatry

## 2020-10-31 ENCOUNTER — Ambulatory Visit: Payer: Medicare HMO

## 2020-10-31 ENCOUNTER — Other Ambulatory Visit: Payer: Self-pay

## 2020-10-31 ENCOUNTER — Encounter: Payer: Self-pay | Admitting: Podiatry

## 2020-10-31 ENCOUNTER — Encounter
Admission: RE | Disposition: A | Discharge status: Home / Self Care | Payer: Self-pay | Source: Home / Self Care | Attending: Podiatry

## 2020-10-31 DIAGNOSIS — E785 Hyperlipidemia, unspecified: Secondary | ICD-10-CM | POA: Insufficient documentation

## 2020-10-31 DIAGNOSIS — Z419 Encounter for procedure for purposes other than remedying health state, unspecified: Secondary | ICD-10-CM

## 2020-10-31 DIAGNOSIS — S82431A Displaced oblique fracture of shaft of right fibula, initial encounter for closed fracture: Secondary | ICD-10-CM | POA: Diagnosis not present

## 2020-10-31 DIAGNOSIS — I252 Old myocardial infarction: Secondary | ICD-10-CM | POA: Diagnosis not present

## 2020-10-31 DIAGNOSIS — S93401A Sprain of unspecified ligament of right ankle, initial encounter: Secondary | ICD-10-CM | POA: Diagnosis not present

## 2020-10-31 DIAGNOSIS — S82891D Other fracture of right lower leg, subsequent encounter for closed fracture with routine healing: Secondary | ICD-10-CM

## 2020-10-31 DIAGNOSIS — X58XXXA Exposure to other specified factors, initial encounter: Secondary | ICD-10-CM | POA: Insufficient documentation

## 2020-10-31 DIAGNOSIS — I10 Essential (primary) hypertension: Secondary | ICD-10-CM | POA: Diagnosis not present

## 2020-10-31 DIAGNOSIS — Z09 Encounter for follow-up examination after completed treatment for conditions other than malignant neoplasm: Secondary | ICD-10-CM

## 2020-10-31 HISTORY — PX: ORIF ANKLE FRACTURE: SHX5408

## 2020-10-31 HISTORY — DX: Other ill-defined heart diseases: I51.89

## 2020-10-31 HISTORY — DX: Essential tremor: G25.0

## 2020-10-31 SURGERY — OPEN REDUCTION INTERNAL FIXATION (ORIF) ANKLE FRACTURE
Anesthesia: General | Site: Ankle | Laterality: Right

## 2020-10-31 MED ORDER — SUCCINYLCHOLINE CHLORIDE 20 MG/ML IJ SOLN
INTRAMUSCULAR | Status: DC | PRN
  Administered 2020-10-31: 120 mg via INTRAVENOUS

## 2020-10-31 MED ORDER — CEFAZOLIN SODIUM-DEXTROSE 2-4 GM/100ML-% IV SOLN
INTRAVENOUS | Status: AC
  Filled 2020-10-31: qty 100

## 2020-10-31 MED ORDER — ASPIRIN EC 325 MG PO TBEC: 325.0000 mg | DELAYED_RELEASE_TABLET | Freq: Two times a day (BID) | ORAL | 0 refills | Status: AC

## 2020-10-31 MED ORDER — MIDAZOLAM HCL 2 MG/2ML IJ SOLN
INTRAMUSCULAR | Status: AC
  Administered 2020-10-31: 1 mg via INTRAVENOUS
  Filled 2020-10-31: qty 2

## 2020-10-31 MED ORDER — ONDANSETRON HCL 4 MG/2ML IJ SOLN: 4.0000 mg | Freq: Once | INTRAMUSCULAR | Status: DC | PRN

## 2020-10-31 MED ORDER — BUPIVACAINE HCL (PF) 0.5 % IJ SOLN
INTRAMUSCULAR | Status: AC
  Filled 2020-10-31: qty 30

## 2020-10-31 MED ORDER — FAMOTIDINE 20 MG PO TABS
ORAL_TABLET | ORAL | Status: AC
  Administered 2020-10-31: 20 mg via ORAL
  Filled 2020-10-31: qty 1

## 2020-10-31 MED ORDER — LACTATED RINGERS IV SOLN: INTRAVENOUS | Status: DC | PRN

## 2020-10-31 MED ORDER — FENTANYL CITRATE (PF) 100 MCG/2ML IJ SOLN
INTRAMUSCULAR | Status: AC
  Administered 2020-10-31: 50 ug via INTRAVENOUS
  Filled 2020-10-31: qty 2

## 2020-10-31 MED ORDER — CEPHALEXIN 500 MG PO CAPS: 500.0000 mg | ORAL_CAPSULE | Freq: Three times a day (TID) | ORAL | 0 refills | Status: AC

## 2020-10-31 MED ORDER — 0.9 % SODIUM CHLORIDE (POUR BTL) OPTIME
TOPICAL | Status: DC | PRN
  Administered 2020-10-31: 500 mL

## 2020-10-31 MED ORDER — OXYCODONE-ACETAMINOPHEN 5-325 MG PO TABS: 1.0000 | ORAL_TABLET | Freq: Four times a day (QID) | ORAL | 0 refills | Status: AC | PRN

## 2020-10-31 MED ORDER — OXYCODONE-ACETAMINOPHEN 5-325 MG PO TABS
ORAL_TABLET | ORAL | Status: AC
  Filled 2020-10-31: qty 1

## 2020-10-31 MED ORDER — ACETAMINOPHEN 10 MG/ML IV SOLN
INTRAVENOUS | Status: DC | PRN
  Administered 2020-10-31: 1000 mg via INTRAVENOUS

## 2020-10-31 MED ORDER — ROCURONIUM BROMIDE 100 MG/10ML IV SOLN
INTRAVENOUS | Status: DC | PRN
  Administered 2020-10-31: 20 mg via INTRAVENOUS
  Administered 2020-10-31: 40 mg via INTRAVENOUS

## 2020-10-31 MED ORDER — PROPOFOL 10 MG/ML IV BOLUS
INTRAVENOUS | Status: AC
  Filled 2020-10-31: qty 20

## 2020-10-31 MED ORDER — BUPIVACAINE HCL (PF) 0.5 % IJ SOLN
INTRAMUSCULAR | Status: AC
  Filled 2020-10-31: qty 10

## 2020-10-31 MED ORDER — CHLORHEXIDINE GLUCONATE 0.12 % MT SOLN
OROMUCOSAL | Status: AC
  Administered 2020-10-31: 15 mL via OROMUCOSAL
  Filled 2020-10-31: qty 15

## 2020-10-31 MED ORDER — OXYCODONE HCL 5 MG PO TABS
ORAL_TABLET | ORAL | Status: AC
  Administered 2020-10-31: 5 mg via ORAL
  Filled 2020-10-31: qty 1

## 2020-10-31 MED ORDER — BUPIVACAINE LIPOSOME 1.3 % IJ SUSP
INTRAMUSCULAR | Status: AC
  Filled 2020-10-31: qty 20

## 2020-10-31 MED ORDER — FENTANYL CITRATE (PF) 100 MCG/2ML IJ SOLN
INTRAMUSCULAR | Status: DC | PRN
  Administered 2020-10-31 (×2): 50 ug via INTRAVENOUS

## 2020-10-31 MED ORDER — DEXMEDETOMIDINE (PRECEDEX) IN NS 20 MCG/5ML (4 MCG/ML) IV SYRINGE
PREFILLED_SYRINGE | INTRAVENOUS | Status: AC
  Filled 2020-10-31: qty 5

## 2020-10-31 MED ORDER — OXYCODONE HCL 5 MG PO TABS: 5.0000 mg | ORAL_TABLET | Freq: Once | ORAL | Status: AC

## 2020-10-31 MED ORDER — DEXMEDETOMIDINE (PRECEDEX) IN NS 20 MCG/5ML (4 MCG/ML) IV SYRINGE
PREFILLED_SYRINGE | INTRAVENOUS | Status: DC | PRN
  Administered 2020-10-31: 12 ug via INTRAVENOUS

## 2020-10-31 MED ORDER — PROPOFOL 10 MG/ML IV BOLUS
INTRAVENOUS | Status: DC | PRN
  Administered 2020-10-31 (×2): 20 mg via INTRAVENOUS
  Administered 2020-10-31: 30 mg via INTRAVENOUS
  Administered 2020-10-31: 130 mg via INTRAVENOUS

## 2020-10-31 MED ORDER — DEXAMETHASONE SODIUM PHOSPHATE 10 MG/ML IJ SOLN
INTRAMUSCULAR | Status: DC | PRN
  Administered 2020-10-31 (×2): 2 mg

## 2020-10-31 MED ORDER — SODIUM CHLORIDE (PF) 0.9 % IJ SOLN
INTRAMUSCULAR | Status: AC
  Filled 2020-10-31: qty 10

## 2020-10-31 MED ORDER — EPHEDRINE SULFATE 50 MG/ML IJ SOLN
INTRAMUSCULAR | Status: DC | PRN
  Administered 2020-10-31: 15 mg via INTRAVENOUS
  Administered 2020-10-31: 5 mg via INTRAVENOUS
  Administered 2020-10-31: 10 mg via INTRAVENOUS

## 2020-10-31 MED ORDER — LIDOCAINE HCL (CARDIAC) PF 100 MG/5ML IV SOSY
PREFILLED_SYRINGE | INTRAVENOUS | Status: DC | PRN
  Administered 2020-10-31: 60 mg via INTRAVENOUS

## 2020-10-31 MED ORDER — ACETAMINOPHEN 10 MG/ML IV SOLN
INTRAVENOUS | Status: AC
  Filled 2020-10-31: qty 100

## 2020-10-31 MED ORDER — ASPIRIN EC 325 MG PO TBEC: 325.0000 mg | DELAYED_RELEASE_TABLET | Freq: Two times a day (BID) | ORAL | 0 refills | Status: DC

## 2020-10-31 MED ORDER — DEXAMETHASONE SODIUM PHOSPHATE 10 MG/ML IJ SOLN
INTRAMUSCULAR | Status: AC
  Filled 2020-10-31: qty 1

## 2020-10-31 MED ORDER — FENTANYL CITRATE (PF) 100 MCG/2ML IJ SOLN
INTRAMUSCULAR | Status: AC
  Filled 2020-10-31: qty 2

## 2020-10-31 MED ORDER — FENTANYL CITRATE (PF) 100 MCG/2ML IJ SOLN: 25.0000 ug | INTRAMUSCULAR | Status: DC | PRN

## 2020-10-31 MED ORDER — FENTANYL CITRATE (PF) 100 MCG/2ML IJ SOLN
50.0000 ug | Freq: Once | INTRAMUSCULAR | Status: AC
Start: 2020-10-31 — End: 2020-10-31

## 2020-10-31 MED ORDER — BUPIVACAINE HCL (PF) 0.25 % IJ SOLN
INTRAMUSCULAR | Status: DC | PRN
  Administered 2020-10-31 (×2): 30 mL

## 2020-10-31 MED ORDER — ONDANSETRON HCL 4 MG/2ML IJ SOLN
INTRAMUSCULAR | Status: DC | PRN
  Administered 2020-10-31: 4 mg via INTRAVENOUS

## 2020-10-31 MED ORDER — MIDAZOLAM HCL 2 MG/2ML IJ SOLN: 1.0000 mg | Freq: Once | INTRAMUSCULAR | Status: AC

## 2020-10-31 SURGICAL SUPPLY — 62 items
BIT DRILL 2.0X130 SLD AO (BIT) ×2 IMPLANT
BIT DRILL 2.4 (BIT) ×1
BIT DRILL 2.4X180 SLD AO (BIT) ×2 IMPLANT
BIT DRILL QC 2.4 MINI 80 (BIT) ×1 IMPLANT
BLADE SURG 15 STRL LF DISP TIS (BLADE) IMPLANT
BLADE SURG 15 STRL SS (BLADE)
BNDG CMPR STD VLCR NS LF 5.8X4 (GAUZE/BANDAGES/DRESSINGS) ×1
BNDG COHESIVE 4X5 TAN STRL (GAUZE/BANDAGES/DRESSINGS) ×4 IMPLANT
BNDG CONFORM 2 STRL LF (GAUZE/BANDAGES/DRESSINGS) ×2 IMPLANT
BNDG CONFORM 3 STRL LF (GAUZE/BANDAGES/DRESSINGS) IMPLANT
BNDG ELASTIC 4X5.8 VLCR NS LF (GAUZE/BANDAGES/DRESSINGS) ×2 IMPLANT
BNDG ESMARK 4X12 TAN STRL LF (GAUZE/BANDAGES/DRESSINGS) ×2 IMPLANT
BNDG GAUZE ELAST 4 BULKY (GAUZE/BANDAGES/DRESSINGS) ×2 IMPLANT
CUFF TOURN SGL QUICK 18X4 (TOURNIQUET CUFF) IMPLANT
CUFF TOURN SGL QUICK 24 (TOURNIQUET CUFF) ×2
CUFF TRNQT CYL 24X4X16.5-23 (TOURNIQUET CUFF) ×1 IMPLANT
DRAPE C-ARM XRAY 36X54 (DRAPES) ×2 IMPLANT
DRAPE C-ARMOR (DRAPES) ×2 IMPLANT
DRILL BIT 2.4MM (BIT) ×2
DURAPREP 26ML APPLICATOR (WOUND CARE) ×4 IMPLANT
ELECT REM PT RETURN 9FT ADLT (ELECTROSURGICAL) ×2
ELECTRODE REM PT RTRN 9FT ADLT (ELECTROSURGICAL) ×1 IMPLANT
GAUZE 4X4 16PLY ~~LOC~~+RFID DBL (SPONGE) ×2 IMPLANT
GAUZE SPONGE 4X4 12PLY STRL (GAUZE/BANDAGES/DRESSINGS) ×2 IMPLANT
GAUZE XEROFORM 1X8 LF (GAUZE/BANDAGES/DRESSINGS) ×2 IMPLANT
GLOVE SURG ENC MOIS LTX SZ7 (GLOVE) ×4 IMPLANT
GLOVE SURG UNDER LTX SZ7 (GLOVE) ×4 IMPLANT
GOWN STRL REUS W/ TWL LRG LVL3 (GOWN DISPOSABLE) ×2 IMPLANT
GOWN STRL REUS W/TWL LRG LVL3 (GOWN DISPOSABLE) ×4
KIT TURNOVER KIT A (KITS) ×2 IMPLANT
LABEL OR SOLS (LABEL) ×2 IMPLANT
MANIFOLD NEPTUNE II (INSTRUMENTS) ×2 IMPLANT
NEEDLE HYPO 22GX1.5 SAFETY (NEEDLE) ×2 IMPLANT
NS IRRIG 500ML POUR BTL (IV SOLUTION) ×2 IMPLANT
PACK EXTREMITY ARMC (MISCELLANEOUS) ×2 IMPLANT
PAD PREP 24X41 OB/GYN DISP (PERSONAL CARE ITEMS) ×2 IMPLANT
PLATE FIB 11H ANATOMICAL RT (Plate) ×4 IMPLANT
SCREW LOCK PLATE R3 2.7X11 (Screw) ×2 IMPLANT
SCREW LOCK PLATE R3 2.7X12 (Screw) ×2 IMPLANT
SCREW LOCK PLATE R3 2.7X13 (Screw) ×2 IMPLANT
SCREW LOCK PLATE R3 2.7X14 (Screw) ×6 IMPLANT
SPLINT CAST 1 STEP 4X30 (MISCELLANEOUS) ×2 IMPLANT
SPLINT FAST PLASTER 5X30 (CAST SUPPLIES)
SPLINT PLASTER CAST FAST 5X30 (CAST SUPPLIES) IMPLANT
SPONGE T-LAP 18X18 ~~LOC~~+RFID (SPONGE) ×2 IMPLANT
STAPLER SKIN PROX 35W (STAPLE) IMPLANT
STOCKINETTE M/LG 89821 (MISCELLANEOUS) ×2 IMPLANT
STRAP SAFETY 5IN WIDE (MISCELLANEOUS) ×2 IMPLANT
STRIP CLOSURE SKIN 1/2X4 (GAUZE/BANDAGES/DRESSINGS) IMPLANT
SUT ETHILON 3-0 FS-10 30 BLK (SUTURE) ×2
SUT VIC AB 2-0 CT1 27 (SUTURE) ×2
SUT VIC AB 2-0 CT1 TAPERPNT 27 (SUTURE) ×1 IMPLANT
SUT VIC AB 3-0 SH 27 (SUTURE) ×4
SUT VIC AB 3-0 SH 27X BRD (SUTURE) ×2 IMPLANT
SUT VIC AB 4-0 SH 27 (SUTURE) ×2
SUT VIC AB 4-0 SH 27XANBCTRL (SUTURE) ×1 IMPLANT
SUTURE EHLN 3-0 FS-10 30 BLK (SUTURE) ×1 IMPLANT
SWABSTK COMLB BENZOIN TINCTURE (MISCELLANEOUS) IMPLANT
SYR 10ML LL (SYRINGE) ×2 IMPLANT
SYR 50ML LL SCALE MARK (SYRINGE) ×2 IMPLANT
TIGHTROPE SYNDESMOSIS (Orthopedic Implant) ×2 IMPLANT
WIRE OLIVE SMOOTH 1.4MMX60MM (WIRE) ×4 IMPLANT

## 2020-10-31 NOTE — Discharge Instructions (Addendum)
[place]  POST OPERATIVE INSTRUCTIONS FOR DR. Vickki Muff AND DR. [place]   Take your medication as prescribed.  Pain medication should be taken only as needed.  If pain is still uncontrolled then try taking Tylenol and/or ibuprofen between pain medication doses.  If pain is still uncontrolled then you may try to take 1 pain tablet every 4 hours.  Try to take 1 pain tablet every 6 hours though as needed for severe pain.  Keep the dressing clean, dry and intact.  Keep your foot elevated above the heart level for the first 48 hours.  Continue elevation thereafter and you may also put ice on the anterior aspect of the right ankle  Make sure to be nonweightbearing to the right lower extremity all times.  Do not take a shower. Baths are permissible as long as the foot is kept out of the water.   Every hour you are awake:  Bend your knee 15 times. Massage calf 15 times  Call [facility] [phone]) if any of the following problems occur: You develop a temperature or fever. The bandage becomes saturated with blood. Medication does not stop your pain. Injury of the foot occurs. Any symptoms of infection including redness, odor, or red streaks running from wound.   AMBULATORY SURGERY  DISCHARGE INSTRUCTIONS   The drugs that you were given will stay in your system until tomorrow so for the next 24 hours you should not:  Drive an automobile Make any legal decisions Drink any alcoholic beverage   You may resume regular meals tomorrow.  Today it is better to start with liquids and gradually work up to solid foods.  You may eat anything you prefer, but it is better to start with liquids, then soup and crackers, and gradually work up to solid foods.   Please notify your doctor immediately if you have any unusual bleeding, trouble breathing, redness and pain at the surgery site, drainage, fever, or pain not  relieved by medication.    Additional Instructions:        Please contact your physician with any problems or Same Day Surgery at [phone], Monday through Friday 6 am to 4 pm, or [place] at [facility] number at [phone].

## 2020-10-31 NOTE — H&P (Signed)
HISTORY AND PHYSICAL INTERVAL NOTE:  10/31/2020  3:59 PM  Jeremiah [name]  has presented today for surgery, with the diagnosis of S82.6- Displaced fracture lateral malleolus S93.431A- Syndesmotic disruption of ankle, right.  The various methods of treatment have been discussed with the patient.  No guarantees were given.  After consideration of risks, benefits and other options for treatment, the patient has consented to surgery.  I have reviewed the patients' chart and labs.    PROCEDURE: RIGHT FIBULAR FRACTURE ORIF, SYNDESMOSIS REPAIR, POSSIBLE DELTOID LIGAMENT REPAIR   A history and physical examination was performed in my office.  The patient was reexamined.  There have been no changes to this history and physical examination.  Caroline More, DPM

## 2020-10-31 NOTE — Anesthesia Preprocedure Evaluation (Addendum)
Anesthesia Evaluation  Patient identified by MRN, date of birth, ID band Patient awake    Reviewed: Allergy & Precautions, H&P , NPO status , Patient's Chart, lab work & pertinent test results, reviewed documented beta blocker date and time   History of Anesthesia Complications Negative for: history of anesthetic complications  Airway Mallampati: III  TM Distance: >3 FB Neck ROM: full    Dental  (+) Teeth Intact   Pulmonary sleep apnea , neg COPD, Patient abstained from smoking.Not current smoker,  Has not worn CPAP in 15 years   Pulmonary exam normal breath sounds clear to auscultation       Cardiovascular Exercise Tolerance: Poor METShypertension, On Medications + CAD, + Past MI and + Cardiac Stents  + dysrhythmias  Rhythm:Regular Rate:Normal  MI/stent in 2019 with resultant tear of coronary vessel per patient, had to be transferred to [facility] for repair with a balloon stent.  LONG TERM CARDIAC EVENT MONITOR STUDY performed in 07/27/2019 1. Predominantly underlying normal sinus rhythm 2. Maximum heart rate 136 bpm 3. Minimum heart rate 39 bpm 4. Average heart rate 56 bpm 5. PACs x 80 with a burden of 0.01% 6. 1 occurrence of SVT with the longest episode lasting 3 beats; rate 131 bpm.    Neuro/Psych negative neurological ROS  negative psych ROS   GI/Hepatic negative GI ROS, Neg liver ROS, neg GERD  ,  Endo/Other  negative endocrine ROSneg diabetes  Renal/GU negative Renal ROS  negative genitourinary   Musculoskeletal   Abdominal   Peds  Hematology negative hematology ROS (+)   Anesthesia Other Findings Past Medical History: No date: Arthritis No date: Chest pain     Comment:  a. 10/2014 Neg MV @ [facility]. No date: Colon polyps No date: Coronary artery disease No date: Essential tremor No date: Grade I diastolic dysfunction No date: History of kidney stones No date: Hydrocele     Comment:  a. 10/2013 s/p L  epididymectomy and hyrocele repair. No date: Hyperlipidemia No date: Hypertension No date: Leukopenia 07/15/2017: NSTEMI (non-ST elevated myocardial infarction) ([place]) No date: Sinus bradycardia No date: Sleep apnea     Comment:  does not have cpap-gave it away  Reproductive/Obstetrics negative OB ROS                            Anesthesia Physical Anesthesia Plan  ASA: 3  Anesthesia Plan: General   Post-op Pain Management:  Regional for Post-op pain   Induction: Intravenous  PONV Risk Score and Plan: 3 and Midazolam, Ondansetron, Dexamethasone and Treatment may vary due to age or medical condition  Airway Management Planned: LMA  Additional Equipment: None  Intra-op Plan:   Post-operative Plan: Extubation in OR  Informed Consent: I have reviewed the patients History and Physical, chart, labs and discussed the procedure including the risks, benefits and alternatives for the proposed anesthesia with the patient or authorized representative who has indicated his/her understanding and acceptance.     Dental advisory given  Plan Discussed with: CRNA  Anesthesia Plan Comments: (Discussed risks of anesthesia with patient, including PONV, sore throat, lip/dental damage. Rare risks discussed as well, such as cardiorespiratory and neurological sequelae. Patient understands. Discussed r/b/a of adductor canal nerve block and popliteal nerve block, including:  - bleeding, infection, nerve damage - poor or non functioning block. Patient understands. )       Anesthesia Quick Evaluation

## 2020-10-31 NOTE — Op Note (Signed)
PODIATRY / FOOT AND ANKLE SURGERY OPERATIVE REPORT    SURGEON: Caroline More, DPM  PRE-OPERATIVE DIAGNOSIS:  1.  Right Danis Weber C fibular fracture 2.  Right syndesmotic disruption ankle  POST-OPERATIVE DIAGNOSIS: Same  PROCEDURE(S): Right distal fibular fracture open reduction with internal fixation Right repair of ankle syndesmosis  HEMOSTASIS: Right thigh tourniquet  ANESTHESIA: general  ESTIMATED BLOOD LOSS: 20 cc  FINDING(S): 1.  Right long oblique fibular fracture above the syndesmosis, Danis Weber C with slight shortening 2.  Syndesmosis disruption with medial clear space widening right  PATHOLOGY/SPECIMEN(S): None  INDICATIONS:   Jeremiah [name] is a 75 y.o. male who presents with right ankle fracture after sustaining a fall and an inversion type injury around a week ago.  Patient was seen in the ER/urgent care setting and x-rays were taken which showed a distal fibular fracture that appeared to be above the syndesmosis with medial clear space widening.  Patient was placed in a compressive dressing and boot and was sent to clinic for further evaluation.  Discussed all treatment options with patient both conservative and surgical attempts at correction include potential risks and complications at this time patient is elected for surgical intervention consisting of right fibular fracture open reduction with internal fixation with syndesmotic repair.  All questions answered including postoperative course in detail.  No guarantees given..  DESCRIPTION: After obtaining full informed written consent, the patient was brought back to the operating room and placed supine upon the operating table.  A popliteal and saphenous nerve block was performed by anesthesia prior to procedure.  The patient received IV antibiotics prior to induction.  After obtaining adequate anesthesia, the patient was prepped and draped in the standard fashion.  An Esmarch bandage was used to exsanguinate the  right lower extremity pneumatic thigh tourniquet was inflated.  Attention was then directed to the lateral ankle where a linear longitudinal incision was made along the distal fibula and extended at the distal fibular shaft.  The incision was deepened through the subcutaneous tissues utilizing sharp blunt dissection and care was taken to identify and retract all vital neurovascular structures no venous contributories were cauterized as necessary.  At this time the superficial peroneal nerve was identified and retracted anteriorly throughout the remainder the case and protected.  At this time an incision was made into the deep fascia of the peroneal muscle belly.  The muscle was then retracted and the midshaft to distal shaft portion of the distal fibula was identified.  A periosteal incision was then made from the distal fibula at the lateral malleolus to the distal fibular shaft.  The periosteum was reflected anteriorly and posteriorly thereby exposing the distal fibula and fracture site.  The fracture appeared to be mildly displaced and appeared to be mildly shortened and laterally rotated.  The fracture was cleaned out with a dental pick and with curette.  The fracture was then placed into a near anatomic position with reduction clamps.  Reduction was then checked under fluoroscopic guidance which appeared to have near anatomic alignment.  The fibula appeared to be out to length.  The medial clear space appeared to be well reduced.  Excellent apposition was noted of the large oblique fracture that was present.  At this time it was determined that would be difficult to throw a lag screw across the fracture site due to the position of the fracture.  To that this time a Paragon 28 distal fibular locking plate was placed and was temporary fixated with  all of wires.  Fluoroscopic guidance was used to assess plate placement which appeared to be excellent overall.  This was checked in both the AP, oblique, and lateral  views.  The fibula appeared to be out to length.  At this time a series of locking screws was placed distal and proximal to the fracture site, all were 2.7 mm locking screws of various length and size based on area of placement.  Excellent reduction appeared to be obtained under fluoroscopic guidance.  Once again the medial clear space appeared to be well reduced.  The cotton hook test was then performed and there was notable gapping at the medial clear space still indicative of a syndesmotic injury.  Attention was then directed through one of the distal holes slightly distal to the fracture line about 1 to 1-1/2 cm above the ankle joint.  The drill for the Arthrex tight rope was then placed through this hole and directed anteriorly approximately 30 degrees in a straight line path from across the fibula through the fibula and into the tibia and through the tibia.  This was checked under fluoroscopic guidance and appeared to have the appropriate orientation.  The instrumentation was then removed and the tight rope suture anchor was then placed.  The button was flipped medially and the tight rope suture was then tightened.  The medial clear space appeared to be well reduced during this time after tightening.  The suture was then cut.  A cotton hook test was then performed again and was noted to not have any medial clear space gapping indicating the syndesmosis was repaired properly.  Supination external rotation test was also performed as well as dorsiflexion eversion testing and noted to have no gapping along the deltoid or in the medial clear space.  The surgical sites were flushed with copious amounts normal sterile saline.  The periosteal and capsular structures then reapproximated well coapted with 3-0 Vicryl.  The subcutaneous tissue was then reapproximated well coapted with combination of 3-0 Vicryl and 4-0 Vicryl.  The skin was then reapproximated well coapted with 4-0 nylon in simple and horizontal mattress  type stitching.  Postoperative dressing was then applied consisting of Xeroform to the incision line followed by 4 x 4 gauze, ABD, Kerlix, web roll, posterior splint, Ace wrap.  The pneumatic thigh tourniquet was deflated and a prompt hyperemic response was noted all digits of the right foot.  The patient tolerated the procedure and anesthesia well was transferred to recovery room vital signs stable vascular status intact all toes of the right foot.  Following.  Postoperative monitoring patient be discharged home with the appropriate medications and discharge orders.  Patient is to remain nonweightbearing at all times to the right lower extremity.  Patient to follow-up in clinic within 1 week of discharge date.  COMPLICATIONS: None  CONDITION: Good, stable  Caroline More, DPM

## 2020-10-31 NOTE — Transfer of Care (Signed)
Immediate Anesthesia Transfer of Care Note  Patient: Jeremiah [name]  Procedure(s) Performed: OPEN REDUCTION INTERNAL FIXATION (ORIF) ANKLE FRACTURE (Right: Ankle)  Patient Location: PACU  Anesthesia Type:General  Level of Consciousness: drowsy  Airway & Oxygen Therapy: Patient Spontanous Breathing and Patient connected to face mask oxygen  Post-op Assessment: Report given to RN and Post -op Vital signs reviewed and stable  Post vital signs: Reviewed, stable  Last Vitals:  Vitals Value Taken Time  BP 124/71 10/31/20 1831  Temp    Pulse 64 10/31/20 1835  Resp 16 10/31/20 1835  SpO2 99 % 10/31/20 1835  Vitals shown include unvalidated device data.  Last Pain:  Vitals:   10/31/20 1418  TempSrc: Oral  PainSc: 2          Complications: No notable events documented.

## 2020-10-31 NOTE — Anesthesia Postprocedure Evaluation (Signed)
Anesthesia Post Note  Patient: Jeremiah [name]  Procedure(s) Performed: OPEN REDUCTION INTERNAL FIXATION (ORIF) ANKLE FRACTURE (Right: Ankle)  Patient location during evaluation: PACU Anesthesia Type: General Level of consciousness: awake and alert Pain management: pain level controlled Vital Signs Assessment: post-procedure vital signs reviewed and stable Respiratory status: spontaneous breathing, nonlabored ventilation, respiratory function stable and patient connected to nasal cannula oxygen Cardiovascular status: blood pressure returned to baseline and stable Postop Assessment: no apparent nausea or vomiting Anesthetic complications: no   No notable events documented.   Last Vitals:  Vitals:   10/31/20 1910 10/31/20 1925  BP: 126/83 122/67  Pulse: 64 62  Resp: 15 13  Temp: 36.6 C   SpO2: 97% 96%    Last Pain:  Vitals:   10/31/20 1910  TempSrc: Temporal  PainSc: 4                  Arita Miss

## 2020-10-31 NOTE — Anesthesia Procedure Notes (Signed)
Anesthesia Regional Block: Popliteal block   Pre-Anesthetic Checklist: , timeout performed,  Correct Patient, Correct Site, Correct Laterality,  Correct Procedure, Correct Position, site marked,  Risks and benefits discussed,  Surgical consent,  Pre-op evaluation,  At surgeon's request and post-op pain management  Laterality: Lower and Right  Prep: chloraprep       Needles:  Injection technique: Single-shot  Needle Type: Echogenic Needle     Needle Length: 9cm  Needle Gauge: 21     Additional Needles:   Procedures:,,,, ultrasound used (permanent image in chart),,    Narrative:  Start time: 10/31/2020 3:45 PM End time: 10/31/2020 3:49 PM Injection made incrementally with aspirations every 5 mL.  Performed by: Personally  Anesthesiologist: Arita Miss, MD  Additional Notes: Patient's chart reviewed and they were deemed appropriate candidate for procedure, at surgeon's request. Patient educated about risks, benefits, and alternatives of the block including but not limited to: temporary or permanent nerve damage, bleeding, infection, damage to surround tissues, block failure, local anesthetic toxicity. Patient expressed understanding. A formal time-out was conducted consistent with institution rules.  Monitors were applied, and minimal sedation used. The site was prepped with skin prep and allowed to dry, and sterile gloves were used. A high frequency linear ultrasound probe with probe cover was utilized throughout. Popliteal artery pulsatile and visualized in popliteal fossa along with adjacent sciatic nerve and its branch point, which appeared anatomically normal, local anesthetic injected around them just proximal to the branch point, and echogenic block needle trajectory was monitored throughout. Aspiration performed every 40ml. Blood vessels were avoided. All injections were performed without resistance and free of blood and paresthesias. The patient tolerated the procedure  well.  Injectate: 60ml 0.25% bupivacaine + 2mg  decadron

## 2020-10-31 NOTE — Anesthesia Procedure Notes (Signed)
Date/Time: 10/31/2020 4:20 PM Performed by: Nani Ravens, CRNA Pre-anesthesia Checklist: Patient identified, Emergency Drugs available, Suction available, Patient being monitored and Timeout performed Patient Re-evaluated:Patient Re-evaluated prior to induction Oxygen Delivery Method: Circle system utilized Preoxygenation: Pre-oxygenation with 100% oxygen Induction Type: IV induction Ventilation: Mask ventilation without difficulty Laryngoscope Size: McGraph, 3 and 4 Grade View: Grade I Tube type: Oral Number of attempts: 1 Airway Equipment and Method: Stylet Placement Confirmation: ETT inserted through vocal cords under direct vision, positive ETCO2, CO2 detector and breath sounds checked- equal and bilateral Secured at: 22 cm Tube secured with: Tape Dental Injury: Teeth and Oropharynx as per pre-operative assessment  Comments: 2 attempts at LMA 5, patient continuing to move. Intubated with ETT.

## 2020-10-31 NOTE — Anesthesia Procedure Notes (Addendum)
Anesthesia Regional Block: Adductor canal block   Pre-Anesthetic Checklist: , timeout performed,  Correct Patient, Correct Site, Correct Laterality,  Correct Procedure, Correct Position, site marked,  Risks and benefits discussed,  Surgical consent,  Pre-op evaluation,  At surgeon's request and post-op pain management  Laterality: Lower and Right  Prep: chloraprep       Needles:  Injection technique: Single-shot  Needle Type: Echogenic Needle     Needle Length: 9cm  Needle Gauge: 21     Additional Needles:   Procedures:,,,, ultrasound used (permanent image in chart),,    Narrative:  Start time: 10/31/2020 3:40 PM End time: 10/31/2020 3:45 PM Injection made incrementally with aspirations every 5 mL.  Performed by: Personally  Anesthesiologist: Arita Miss, MD  Additional Notes: Patient's chart reviewed and they were deemed appropriate candidate for procedure, per surgeon's request. Patient educated about risks, benefits, and alternatives of the block including but not limited to: temporary or permanent nerve damage, bleeding, infection, damage to surround tissues, block failure, local anesthetic toxicity. Patient expressed understanding. A formal time-out was conducted consistent with institution rules.  Monitors were applied, and minimal sedation used (see nursing record). The site was prepped with skin prep and allowed to dry, and sterile gloves were used. A high frequency linear ultrasound probe with probe cover was utilized throughout. Femoral artery visualized at mid-thigh level, local anesthetic injected anterolateral to it, and echogenic block needle trajectory was monitored throughout. Hydrodissection of saphenous nerve visualized and appeared anatomically normal. Aspiration performed every 26ml. Blood vessels were avoided. All injections were performed without resistance and free of blood and paresthesias. The patient tolerated the procedure well.  Injectate: 21ml 0.25%  bupivacaine + 2mg  decadron

## 2020-11-03 ENCOUNTER — Encounter: Payer: Self-pay | Admitting: Podiatry

## 2021-08-06 ENCOUNTER — Other Ambulatory Visit (HOSPITAL_COMMUNITY): Payer: Self-pay | Admitting: Nurse Practitioner

## 2021-08-06 ENCOUNTER — Other Ambulatory Visit: Payer: Self-pay | Admitting: Nurse Practitioner

## 2021-08-06 DIAGNOSIS — R1310 Dysphagia, unspecified: Secondary | ICD-10-CM

## 2021-08-18 ENCOUNTER — Ambulatory Visit
Admission: RE | Admit: 2021-08-18 | Discharge: 2021-08-18 | Disposition: A | Discharge status: Home / Self Care | Payer: Medicare HMO | Source: Ambulatory Visit | Attending: Nurse Practitioner | Admitting: Nurse Practitioner

## 2021-08-18 DIAGNOSIS — R1310 Dysphagia, unspecified: Secondary | ICD-10-CM | POA: Diagnosis present

## 2021-09-18 ENCOUNTER — Encounter
Admission: RE | Disposition: A | Discharge status: Home / Self Care | Payer: Self-pay | Source: Home / Self Care | Attending: Gastroenterology

## 2021-09-18 ENCOUNTER — Ambulatory Visit
Admission: RE | Admit: 2021-09-18 | Discharge: 2021-09-18 | Disposition: A | Discharge status: Home / Self Care | Payer: Medicare HMO | Attending: Gastroenterology | Admitting: Gastroenterology

## 2021-09-18 ENCOUNTER — Ambulatory Visit: Payer: Medicare HMO | Admitting: Anesthesiology

## 2021-09-18 DIAGNOSIS — K640 First degree hemorrhoids: Secondary | ICD-10-CM | POA: Insufficient documentation

## 2021-09-18 DIAGNOSIS — I2510 Atherosclerotic heart disease of native coronary artery without angina pectoris: Secondary | ICD-10-CM | POA: Insufficient documentation

## 2021-09-18 DIAGNOSIS — Z800 Family history of malignant neoplasm of digestive organs: Secondary | ICD-10-CM | POA: Diagnosis not present

## 2021-09-18 DIAGNOSIS — K449 Diaphragmatic hernia without obstruction or gangrene: Secondary | ICD-10-CM | POA: Insufficient documentation

## 2021-09-18 DIAGNOSIS — E669 Obesity, unspecified: Secondary | ICD-10-CM | POA: Diagnosis not present

## 2021-09-18 DIAGNOSIS — R1310 Dysphagia, unspecified: Secondary | ICD-10-CM | POA: Insufficient documentation

## 2021-09-18 DIAGNOSIS — E785 Hyperlipidemia, unspecified: Secondary | ICD-10-CM | POA: Diagnosis not present

## 2021-09-18 DIAGNOSIS — I10 Essential (primary) hypertension: Secondary | ICD-10-CM | POA: Diagnosis not present

## 2021-09-18 DIAGNOSIS — Z1211 Encounter for screening for malignant neoplasm of colon: Secondary | ICD-10-CM | POA: Diagnosis not present

## 2021-09-18 DIAGNOSIS — Z6828 Body mass index (BMI) 28.0-28.9, adult: Secondary | ICD-10-CM | POA: Diagnosis not present

## 2021-09-18 DIAGNOSIS — Z79899 Other long term (current) drug therapy: Secondary | ICD-10-CM | POA: Insufficient documentation

## 2021-09-18 DIAGNOSIS — G4730 Sleep apnea, unspecified: Secondary | ICD-10-CM | POA: Insufficient documentation

## 2021-09-18 DIAGNOSIS — I252 Old myocardial infarction: Secondary | ICD-10-CM | POA: Diagnosis not present

## 2021-09-18 DIAGNOSIS — K3189 Other diseases of stomach and duodenum: Secondary | ICD-10-CM | POA: Insufficient documentation

## 2021-09-18 DIAGNOSIS — Z86010 Personal history of colonic polyps: Secondary | ICD-10-CM | POA: Diagnosis not present

## 2021-09-18 HISTORY — PX: COLONOSCOPY WITH PROPOFOL: SHX5780

## 2021-09-18 HISTORY — PX: ESOPHAGOGASTRODUODENOSCOPY: SHX5428

## 2021-09-18 SURGERY — COLONOSCOPY WITH PROPOFOL
Anesthesia: General

## 2021-09-18 MED ORDER — PROPOFOL 10 MG/ML IV BOLUS
INTRAVENOUS | Status: DC | PRN
  Administered 2021-09-18: 60 mg via INTRAVENOUS

## 2021-09-18 MED ORDER — PROPOFOL 500 MG/50ML IV EMUL
INTRAVENOUS | Status: DC | PRN
  Administered 2021-09-18: 125 ug/kg/min via INTRAVENOUS

## 2021-09-18 MED ORDER — LIDOCAINE HCL (CARDIAC) PF 100 MG/5ML IV SOSY
PREFILLED_SYRINGE | INTRAVENOUS | Status: DC | PRN
  Administered 2021-09-18: 40 mg via INTRAVENOUS

## 2021-09-18 MED ORDER — SODIUM CHLORIDE 0.9 % IV SOLN
INTRAVENOUS | Status: DC
  Administered 2021-09-18: 20 mL/h via INTRAVENOUS

## 2021-09-18 NOTE — Op Note (Signed)
[facility] Gastroenterology Patient Name: Jeremiah [name] Procedure Date: 09/18/2021 12:41 PM MRN: [id number] Account #: [id number] Date of Birth: [birth year] Admit Type: Outpatient Age: 76 Room: [facility] ENDO ROOM 3 Gender: Male Note Status: Finalized Instrument Name: Michaelle Birks [id number] Procedure:             Upper GI endoscopy Indications:           Dysphagia Providers:             Andrey Farmer MD, MD Referring MD:          Merwyn Katos, MD (Referring MD) Medicines:             Monitored Anesthesia Care Complications:         No immediate complications. Estimated blood loss:                         Minimal. Procedure:             Pre-Anesthesia Assessment:                        - Prior to the procedure, a History and Physical was                         performed, and patient medications and allergies were                         reviewed. The patient is competent. The risks and                         benefits of the procedure and the sedation options and                         risks were discussed with the patient. All questions                         were answered and informed consent was obtained.                         Patient identification and proposed procedure were                         verified by the physician, the nurse, the                         anesthesiologist, the anesthetist and the technician                         in the endoscopy suite. Mental Status Examination:                         alert and oriented. Airway Examination: normal                         oropharyngeal airway and neck mobility. Respiratory                         Examination: clear to auscultation. CV Examination:  normal. Prophylactic Antibiotics: The patient does not                         require prophylactic antibiotics. Prior                         Anticoagulants: The patient has taken no previous                          anticoagulant or antiplatelet agents except for                         aspirin. ASA Grade Assessment: II - A patient with                         mild systemic disease. After reviewing the risks and                         benefits, the patient was deemed in satisfactory                         condition to undergo the procedure. The anesthesia                         plan was to use monitored anesthesia care (MAC).                         Immediately prior to administration of medications,                         the patient was re-assessed for adequacy to receive                         sedatives. The heart rate, respiratory rate, oxygen                         saturations, blood pressure, adequacy of pulmonary                         ventilation, and response to care were monitored                         throughout the procedure. The physical status of the                         patient was re-assessed after the procedure.                        After obtaining informed consent, the endoscope was                         passed under direct vision. Throughout the procedure,                         the patient's blood pressure, pulse, and oxygen                         saturations were monitored continuously. The Endoscope  was introduced through the mouth, and advanced to the                         second part of duodenum. The upper GI endoscopy was                         accomplished without difficulty. The patient tolerated                         the procedure well. Findings:      The examined esophagus was normal. Biopsies were obtained from the       proximal and distal esophagus with cold forceps for histology of       suspected eosinophilic esophagitis. Estimated blood loss was minimal.      A small hiatal hernia was present.      Multiple localized small erosions with no bleeding and no stigmata of       recent bleeding were found in the gastric  antrum. Biopsies were taken       with a cold forceps for histology. Estimated blood loss was minimal.      The examined duodenum was normal. Impression:            - Normal esophagus. Biopsied.                        - Small hiatal hernia.                        - Erosive gastropathy with no bleeding and no stigmata                         of recent bleeding. Biopsied.                        - Normal examined duodenum. Recommendation:        - Await pathology results.                        - Perform a colonoscopy today. Procedure Code(s):     --- Professional ---                        [phone], Esophagogastroduodenoscopy, flexible,                         transoral; with biopsy, single or multiple Diagnosis Code(s):     --- Professional ---                        K44.9, Diaphragmatic hernia without obstruction or                         gangrene                        K31.89, Other diseases of stomach and duodenum                        R13.10, Dysphagia, unspecified CPT copyright 2019 [employer] Association. All rights reserved. The codes documented in this report are preliminary and upon coder review may  be revised to meet current compliance  requirements. Andrey Farmer MD, MD 09/18/2021 1:14:14 PM Number of Addenda: 0 Note Initiated On: 09/18/2021 12:41 PM Estimated Blood Loss:  Estimated blood loss was minimal.      [facility]

## 2021-09-18 NOTE — Interval H&P Note (Signed)
History and Physical Interval Note:  09/18/2021 12:40 PM  Jeremiah [name]  has presented today for surgery, with the diagnosis of Hx of Adenomatous Polyps Dysphagia.  The various methods of treatment have been discussed with the patient and family. After consideration of risks, benefits and other options for treatment, the patient has consented to  Procedure(s): COLONOSCOPY WITH PROPOFOL (N/A) ESOPHAGOGASTRODUODENOSCOPY (EGD) (N/A) as a surgical intervention.  The patient's history has been reviewed, patient examined, no change in status, stable for surgery.  I have reviewed the patient's chart and labs.  Questions were answered to the patient's satisfaction.     Lesly Rubenstein  Ok to proceed with EGD/Colonoscopy

## 2021-09-18 NOTE — Anesthesia Preprocedure Evaluation (Addendum)
Anesthesia Evaluation  Patient identified by MRN, date of birth, ID band Patient awake    Reviewed: Allergy & Precautions, NPO status , Patient's Chart, lab work & pertinent test results  History of Anesthesia Complications Negative for: history of anesthetic complications  Airway Mallampati: I   Neck ROM: Full    Dental  (+) Missing   Pulmonary sleep apnea ,    Pulmonary exam normal breath sounds clear to auscultation       Cardiovascular hypertension, + CAD (s/p MI and stent)  Normal cardiovascular exam Rhythm:Regular Rate:Normal  ECG 10/31/20:  Sinus bradycardia Incomplete right bundle branch block Cannot rule out Anterior infarct , age undetermined   Neuro/Psych negative neurological ROS     GI/Hepatic GERD  ,  Endo/Other  Obesity   Renal/GU Renal disease (nephrolithiasis)     Musculoskeletal   Abdominal   Peds  Hematology negative hematology ROS (+)   Anesthesia Other Findings Cardiology note 04/01/21:  Attending: Agree with above; patient seen and examined and plan discussed with patient. It was a pleasure to meet Mr. Locy for his initial visit to our clinic. He will be followed in our clinic secondary to his history of atherosclerotic coronary artery disease, hypertension, and hyperlipidemia. Overall, he is doing very well from a cardiovascular standpoint. He is helping care for his wife who was recently diagnosed with liver cancer. And further evaluation of his hypertension, the patient will purchase a blood pressure cuff and monitor his blood pressure. We have asked him to follow-up with [place] via MyChart if his systolic is consistently greater than 326 and his diastolic is consistently greater than 80. As for his hyperlipidemia, he is on Lipitor 40 mg daily. His LDL remains elevated at 92. He has not tolerated a higher dose of Lipitor in the past. We will add Zetia 10 mg daily to his medication regimen and plan  to check a lipid panel at his next clinic visit. As for his history of atherosclerotic coronary artery disease, he is doing well without any anginal symptoms. He has not had his ventricular function assessed since presenting with a non-ST elevation myocardial infarction. We will plan to get a transthoracic echocardiogram at his next clinic visit. I emphasized that she should not hesitate to contact me with any questions or concerns. We will look forward to seeing him in 6 months or sooner if needed.   Reproductive/Obstetrics                            Anesthesia Physical Anesthesia Plan  ASA: 3  Anesthesia Plan: General   Post-op Pain Management:    Induction: Intravenous  PONV Risk Score and Plan: 2 and Propofol infusion, TIVA and Treatment may vary due to age or medical condition  Airway Management Planned: Natural Airway  Additional Equipment:   Intra-op Plan:   Post-operative Plan:   Informed Consent: I have reviewed the patients History and Physical, chart, labs and discussed the procedure including the risks, benefits and alternatives for the proposed anesthesia with the patient or authorized representative who has indicated his/her understanding and acceptance.       Plan Discussed with: CRNA  Anesthesia Plan Comments: (LMA/GETA backup discussed.  Patient consented for risks of anesthesia including but not limited to:  - adverse reactions to medications - damage to eyes, teeth, lips or other oral mucosa - nerve damage due to positioning  - sore throat or hoarseness - damage to heart,  brain, nerves, lungs, other parts of body or loss of life  Informed patient about role of CRNA in peri- and intra-operative care.  Patient voiced understanding.)        Anesthesia Quick Evaluation

## 2021-09-18 NOTE — H&P (Signed)
Outpatient short stay form Pre-procedure 09/18/2021  Lesly Rubenstein, MD  Primary Physician: Rogelia Rohrer, MD  Reason for visit:  Dysphagia/Surveillance colonoscopy  History of present illness:    76 y/o gentleman with history of CAD here for EGD/Colonoscopy for recent onset of dysphagia (normal barium swallow) and surveillance colonoscopy, last colonoscopy in 2017 with non-advanced adenomas. No abdominal surgeries. No blood thinners. Father had colon cancer at age 60.    Current Facility-Administered Medications:    0.9 %  sodium chloride infusion, , Intravenous, Continuous, Verlaine Embry, Hilton Cork, MD, Last Rate: 20 mL/hr at 09/18/21 1200, 20 mL/hr at 09/18/21 1200  Medications Prior to Admission  Medication Sig Dispense Refill Last Dose   aspirin EC 81 MG tablet Take 81 mg by mouth daily. Swallow whole.   09/17/2021   atorvastatin (LIPITOR) 40 MG tablet TAKE 1 TABLET DAILY (Patient taking differently: Take 40 mg by mouth every morning.) 3 tablet 0 09/17/2021   cetirizine (ZYRTEC) 10 MG tablet Take 10 mg by mouth daily as needed for allergies.   09/17/2021   cholecalciferol (VITAMIN D) 1000 units tablet Take 1,000 Units by mouth daily.   09/17/2021   Cyanocobalamin 1500 MCG TBDP Take 1,500 mcg by mouth daily.   09/17/2021   gabapentin (NEURONTIN) 300 MG capsule Take 300 mg by mouth 3 (three) times daily.   09/17/2021   metoprolol succinate (TOPROL-XL) 25 MG 24 hr tablet Take 1 tablet (25 mg total) by mouth daily. (Patient taking differently: Take 25 mg by mouth every morning.) 90 tablet 3 09/18/2021 at 0630   nitroGLYCERIN (NITROSTAT) 0.4 MG SL tablet Place 1 tablet (0.4 mg total) under the tongue every 5 (five) minutes x 3 doses as needed for chest pain. 15 tablet 12 09/17/2021   Omega-3 Fatty Acids (FISH OIL) 1000 MG CAPS Take 1,000 mg by mouth daily.    09/17/2021   OVER THE COUNTER MEDICATION Take 1 tablet by mouth daily. Neuro Enhancer supplement   09/17/2021   oxyCODONE-acetaminophen  (PERCOCET/ROXICET) 5-325 MG tablet Take by mouth every 4 (four) hours as needed for severe pain.   09/17/2021   tamsulosin (FLOMAX) 0.4 MG CAPS capsule Take 0.4 mg by mouth daily after breakfast.   09/17/2021     Allergies  Allergen Reactions   Short Ragweed Pollen Ext Other (See Comments)    Congestion and eye itchiness     Past Medical History:  Diagnosis Date   Arthritis    Chest pain    a. 10/2014 Neg MV @ [facility].   Colon polyps    Coronary artery disease    Essential tremor    Grade I diastolic dysfunction    History of kidney stones    Hydrocele    a. 10/2013 s/p L epididymectomy and hyrocele repair.   Hyperlipidemia    Hypertension    Leukopenia    NSTEMI (non-ST elevated myocardial infarction) ([place]) 07/15/2017   Sinus bradycardia    Sleep apnea    does not have cpap-gave it away    Review of systems:  Otherwise negative.    Physical Exam  Gen: Alert, oriented. Appears stated age.  HEENT: PERRLA. Lungs: No respiratory distress CV: RRR Abd: soft, benign, no masses Ext: No edema    Planned procedures: Proceed with EGD/colonoscopy. The patient understands the nature of the planned procedure, indications, risks, alternatives and potential complications including but not limited to bleeding, infection, perforation, damage to internal organs and possible oversedation/side effects from anesthesia. The patient agrees and gives consent  to proceed.  Please refer to procedure notes for findings, recommendations and patient disposition/instructions.     Lesly Rubenstein, MD [facility]

## 2021-09-18 NOTE — Transfer of Care (Signed)
Immediate Anesthesia Transfer of Care Note  Patient: Jeremiah [name]  Procedure(s) Performed: COLONOSCOPY WITH PROPOFOL ESOPHAGOGASTRODUODENOSCOPY (EGD)  Patient Location: PACU  Anesthesia Type:General  Level of Consciousness: awake, alert  and oriented  Airway & Oxygen Therapy: Patient Spontanous Breathing and Patient connected to nasal cannula oxygen  Post-op Assessment: Report given to RN and Post -op Vital signs reviewed and stable  Post vital signs: Reviewed and stable  Last Vitals:  Vitals Value Taken Time  BP 116/68 09/18/21 1312  Temp 36.8 C 09/18/21 1312  Pulse 48 09/18/21 1313  Resp 16 09/18/21 1313  SpO2 100 % 09/18/21 1313  Vitals shown include unvalidated device data.  Last Pain:  Vitals:   09/18/21 1312  TempSrc: Temporal  PainSc: Asleep         Complications: No notable events documented.

## 2021-09-18 NOTE — Anesthesia Postprocedure Evaluation (Signed)
Anesthesia Post Note  Patient: Jeremiah [name]  Procedure(s) Performed: COLONOSCOPY WITH PROPOFOL ESOPHAGOGASTRODUODENOSCOPY (EGD)  Patient location during evaluation: PACU Anesthesia Type: General Level of consciousness: awake and alert, oriented and patient cooperative Pain management: pain level controlled Vital Signs Assessment: post-procedure vital signs reviewed and stable Respiratory status: spontaneous breathing, nonlabored ventilation and respiratory function stable Cardiovascular status: blood pressure returned to baseline and stable Postop Assessment: adequate PO intake Anesthetic complications: no   No notable events documented.   Last Vitals:  Vitals:   09/18/21 1312 09/18/21 1322  BP: 116/68 120/67  Pulse:    Resp:    Temp: 36.8 C   SpO2:      Last Pain:  Vitals:   09/18/21 1322  TempSrc:   PainSc: 0-No pain                 Darrin Nipper

## 2021-09-18 NOTE — Op Note (Signed)
[facility] Patient Name: Jeremiah [name] Procedure Date: 09/18/2021 12:40 PM MRN: [id number] Account #: [id number] Date of Birth: 06/11/1945 Admit Type: Outpatient Age: 76 Room: [facility] ENDO ROOM 3 Gender: Male Note Status: Finalized Instrument Name: Park Meo [id number] Procedure:             Colonoscopy Indications:           Surveillance: Personal history of adenomatous polyps                         on last colonoscopy > 5 years ago Providers:             Andrey Farmer MD, MD Referring MD:          Merwyn Katos, MD (Referring MD) Medicines:             Monitored Anesthesia Care Complications:         No immediate complications. Procedure:             Pre-Anesthesia Assessment:                        - Prior to the procedure, a History and Physical was                         performed, and patient medications and allergies were                         reviewed. The patient is competent. The risks and                         benefits of the procedure and the sedation options and                         risks were discussed with the patient. All questions                         were answered and informed consent was obtained.                         Patient identification and proposed procedure were                         verified by the physician, the nurse, the                         anesthesiologist, the anesthetist and the technician                         in the endoscopy suite. Mental Status Examination:                         alert and oriented. Airway Examination: normal                         oropharyngeal airway and neck mobility. Respiratory                         Examination: clear to auscultation. CV Examination:  normal. Prophylactic Antibiotics: The patient does not                         require prophylactic antibiotics. Prior                         Anticoagulants: The patient has taken no previous                          anticoagulant or antiplatelet agents. ASA Grade                         Assessment: II - A patient with mild systemic disease.                         After reviewing the risks and benefits, the patient                         was deemed in satisfactory condition to undergo the                         procedure. The anesthesia plan was to use monitored                         anesthesia care (MAC). Immediately prior to                         administration of medications, the patient was                         re-assessed for adequacy to receive sedatives. The                         heart rate, respiratory rate, oxygen saturations,                         blood pressure, adequacy of pulmonary ventilation, and                         response to care were monitored throughout the                         procedure. The physical status of the patient was                         re-assessed after the procedure.                        After obtaining informed consent, the colonoscope was                         passed under direct vision. Throughout the procedure,                         the patient's blood pressure, pulse, and oxygen                         saturations were monitored continuously. The  Colonoscope was introduced through the anus and                         advanced to the the cecum, identified by appendiceal                         orifice and ileocecal valve. The colonoscopy was                         somewhat difficult due to significant looping.                         Successful completion of the procedure was aided by                         applying abdominal pressure. The patient tolerated the                         procedure well. The quality of the bowel preparation                         was adequate. Findings:      The perianal and digital rectal examinations were normal.      Internal hemorrhoids were found during  retroflexion. The hemorrhoids       were Grade I (internal hemorrhoids that do not prolapse).      The exam was otherwise without abnormality on direct and retroflexion       views. Impression:            - Internal hemorrhoids.                        - The examination was otherwise normal on direct and                         retroflexion views.                        - No specimens collected. Recommendation:        - Discharge patient to home.                        - Resume previous diet.                        - Continue present medications.                        - Repeat colonoscopy is not recommended due to current                         age (33 years or older) for surveillance.                        - Return to referring physician as previously                         scheduled. Procedure Code(s):     --- Professional ---  G0105, Colorectal cancer screening; colonoscopy on                         individual at high risk Diagnosis Code(s):     --- Professional ---                        Z86.010, Personal history of colonic polyps                        K64.0, First degree hemorrhoids CPT copyright 2019 [employer]. All rights reserved. The codes documented in this report are preliminary and upon coder review may  be revised to meet current compliance requirements. Andrey Farmer MD, MD 09/18/2021 1:16:47 PM Number of Addenda: 0 Note Initiated On: 09/18/2021 12:40 PM Scope Withdrawal Time: 0 hours 5 minutes 31 seconds  Total Procedure Duration: 0 hours 14 minutes 35 seconds  Estimated Blood Loss:  Estimated blood loss: none.      [facility]

## 2021-09-22 ENCOUNTER — Encounter: Payer: Self-pay | Admitting: Gastroenterology

## 2021-09-22 LAB — SURGICAL PATHOLOGY

## 2022-03-05 IMAGING — CR DG ANKLE COMPLETE 3+V*R*
3 series · 3 of 3 positions shown · non-contrast
Comparison: None.

CLINICAL DATA: 74-year-old male with fall and right ankle pain.

EXAM:
RIGHT ANKLE - COMPLETE 3+ VIEW

[ankle ap]
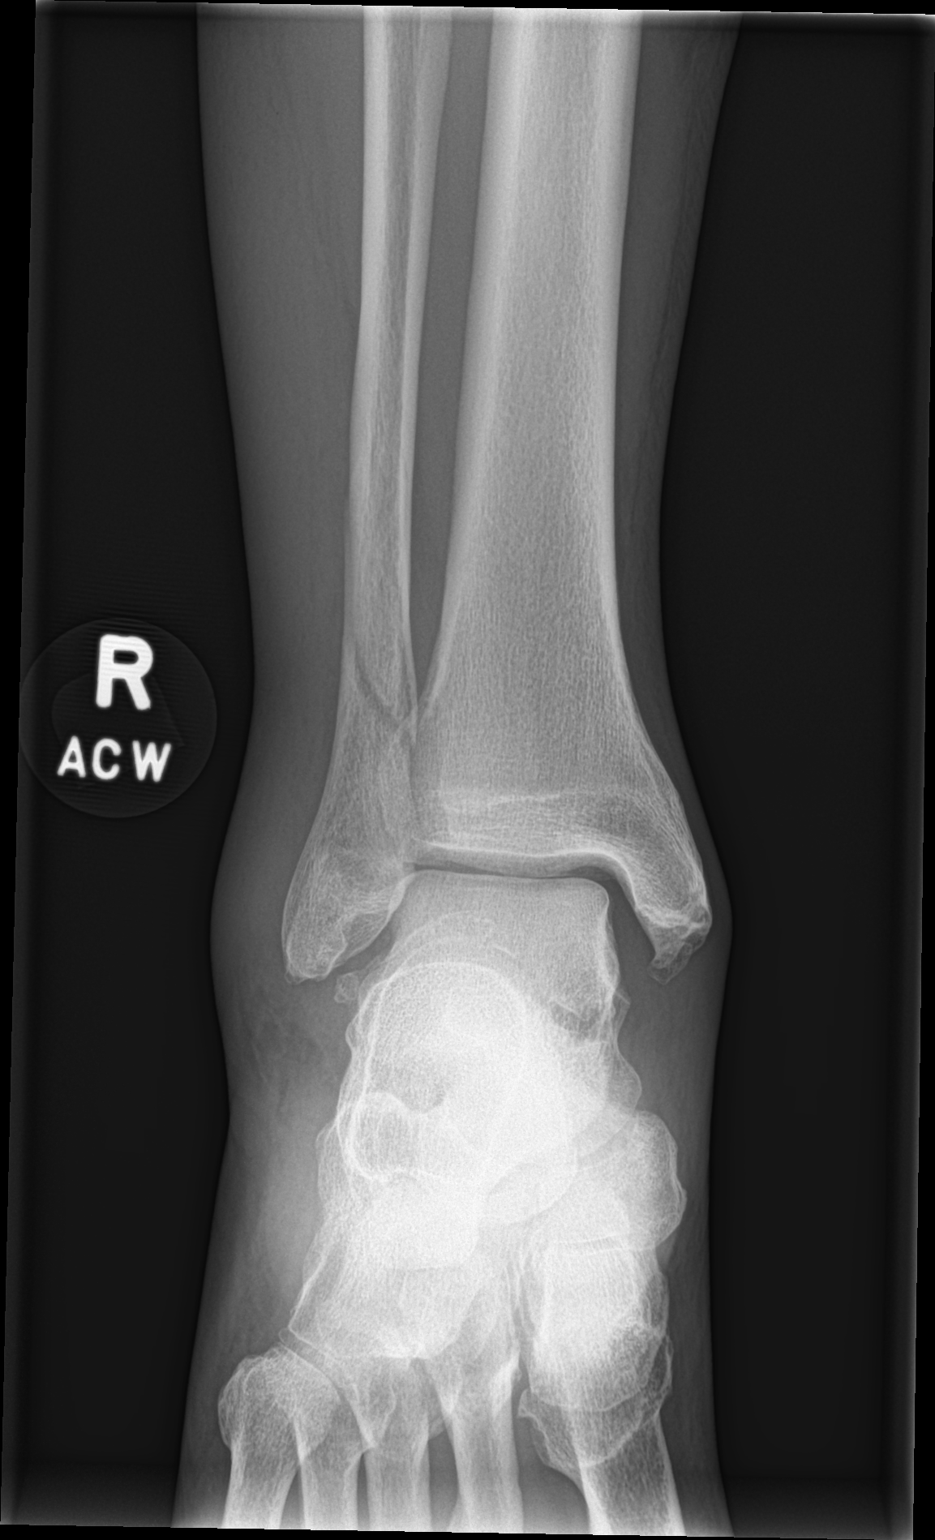

[ankle obl]
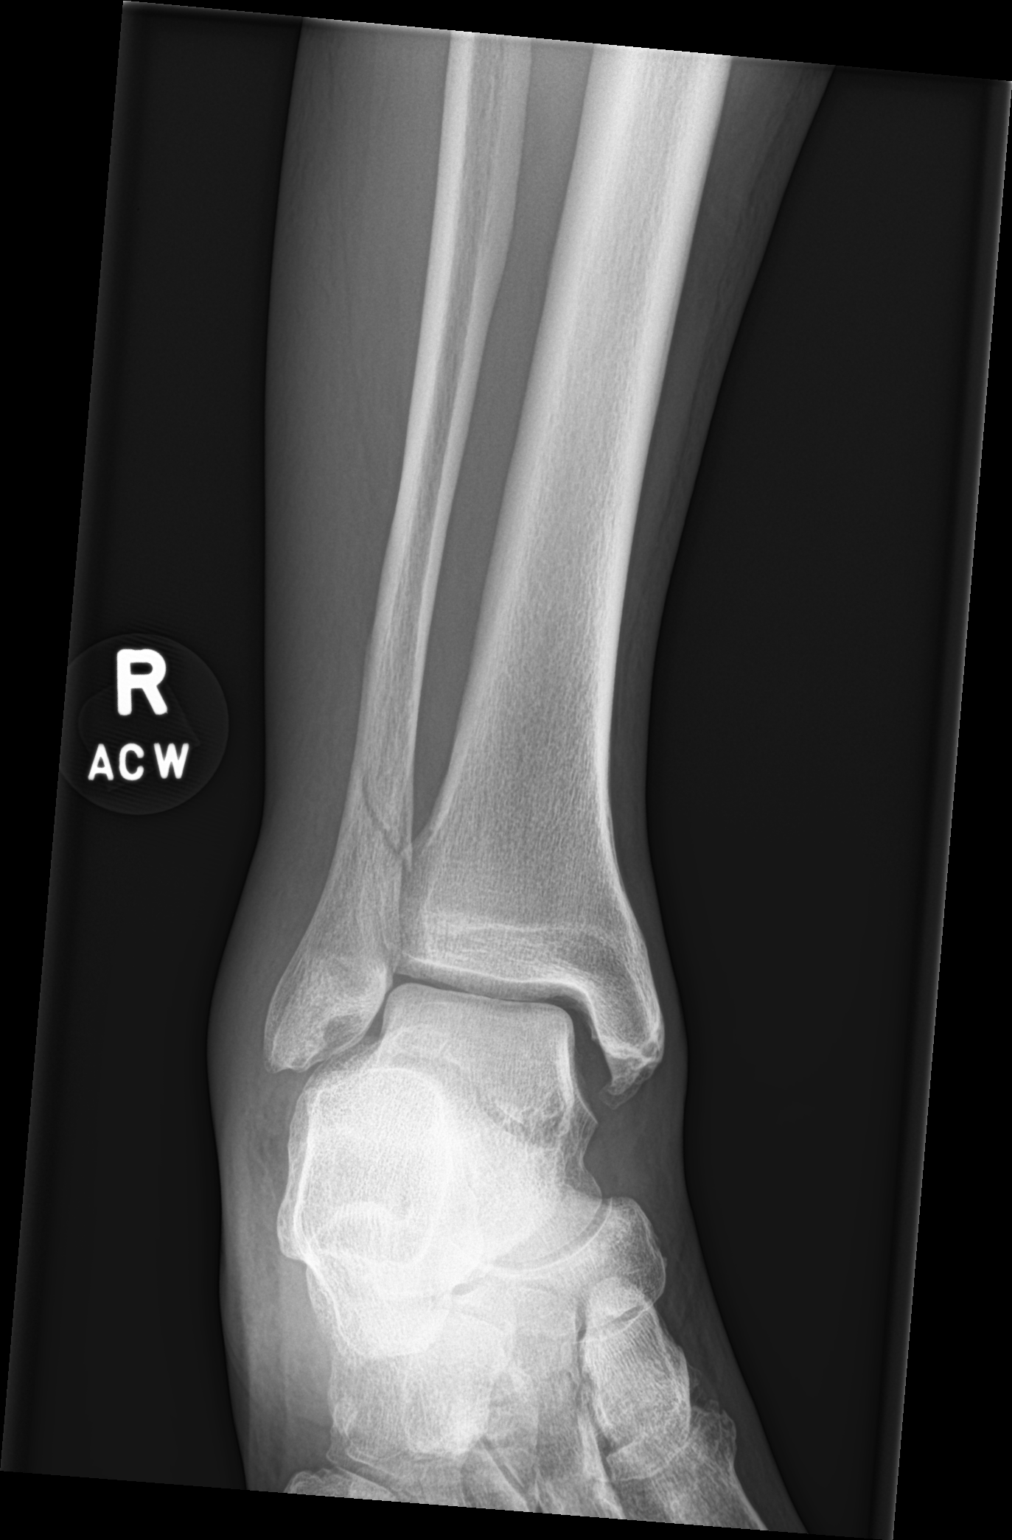

[ankle lat]
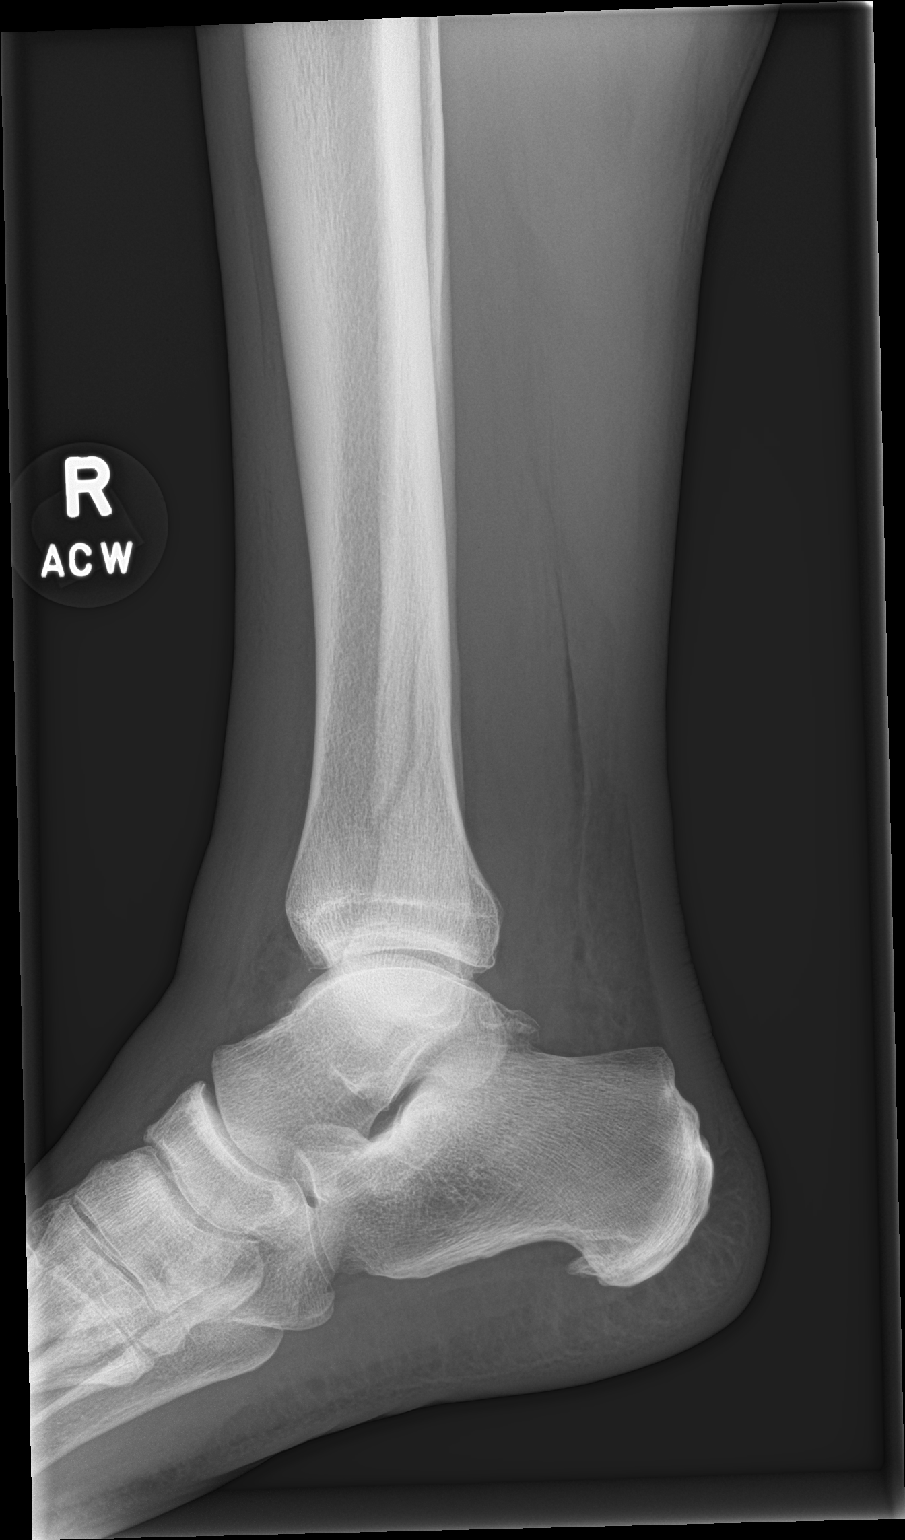

[3 of 3 positions shown; findings below may reference images not displayed]

FINDINGS: There is a minimally displaced oblique fracture of the distal
fibula. No other acute fracture. There is apparent minimal lateral
subluxation of the ankle mortise. There is soft tissue swelling of
the ankle. No radiopaque foreign object or soft tissue gas.
IMPRESSION: 1. Minimally displaced oblique fracture of the distal fibula.
2. Apparent minimal lateral subluxation of the ankle mortise.

## 2022-12-30 IMAGING — RF DG ESOPHAGUS
10 of 11 series · 14 of 24 positions shown · non-contrast
Comparison: NONE.

INDICATION: Patient complains of reflux symptoms and chest pain with swallowing
prior endoscopy procedures.

EXAM:
ESOPHAGUS/BARIUM SWALLOW/TABLET STUDY
TECHNIQUE: Combined double and single contrast examination was performed using
effervescent crystals, thick barium liquid and thin barium liquid.
The patient was observed with fluoroscopy swallowing a 13 mm barium
sulphate tablet.
FLUOROSCOPY TIME:  Radiation Exposure Index (as provided by the
fluoroscopic device): 45.60 mGy

[Series 1: cp_standard · 0.17mm/px · 1 of 1 slices shown (1 of 10)]
[im 1/1]
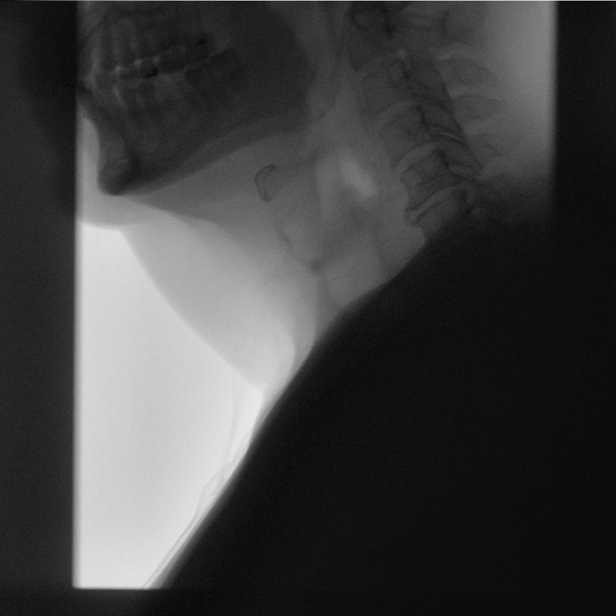

[Series 2: cp_standard · 0.17mm/px · 1 of 50 frames shown (2 of 10)]
[frame 43/50]
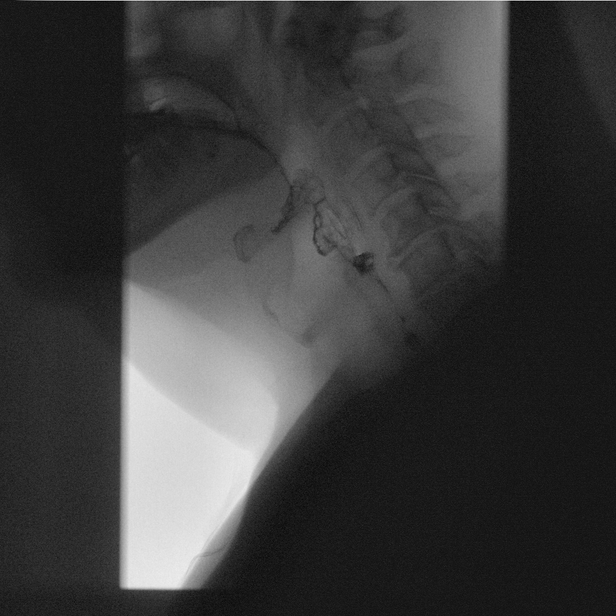

[Series 3: cp_standard · 0.17mm/px · 1 of 87 frames shown (3 of 10)]
[frame 44/87]
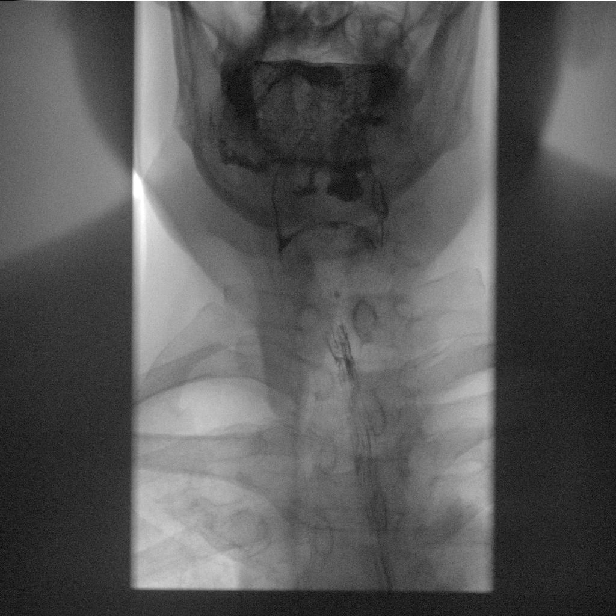

[Series 5: cp_standard · 0.25mm/px · 2 of 74 frames shown (4 of 10)]
[frame 12/74]
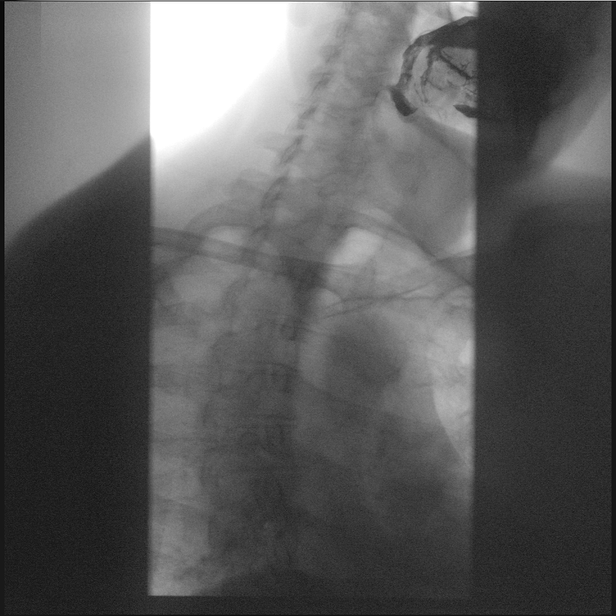
[frame 38/74]
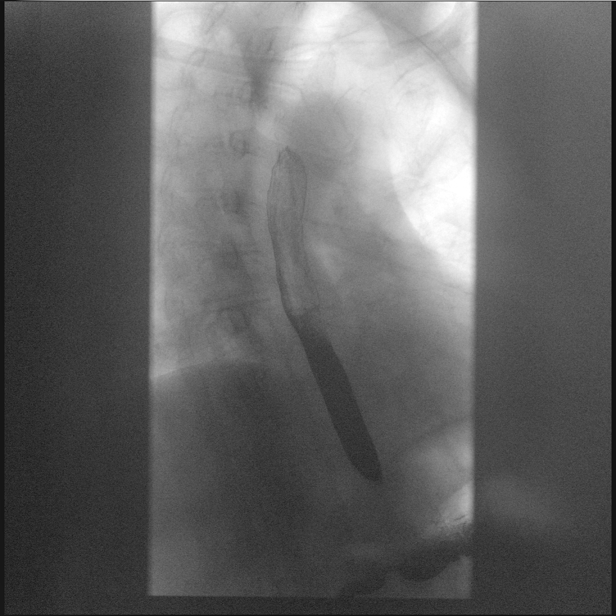

[Series 6: cp_standard · 0.26mm/px · 1 of 148 frames shown (5 of 10)]
[frame 23/148]
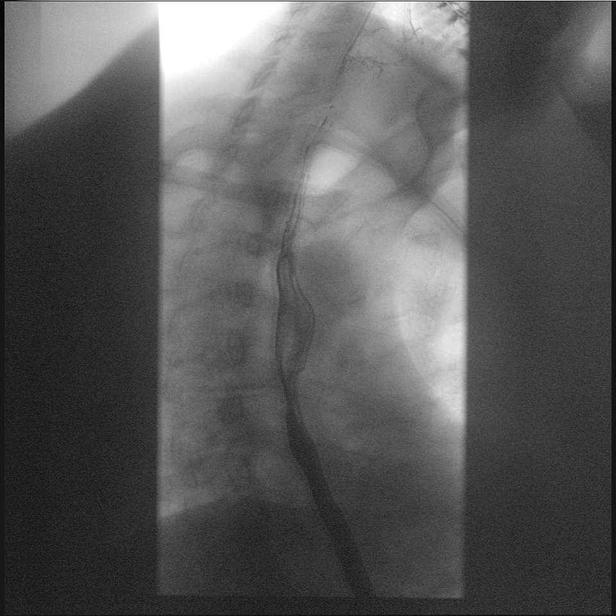

[Series 7: cp_standard · 0.26mm/px · 2 of 229 frames shown (6 of 10)]
[frame 35/229]
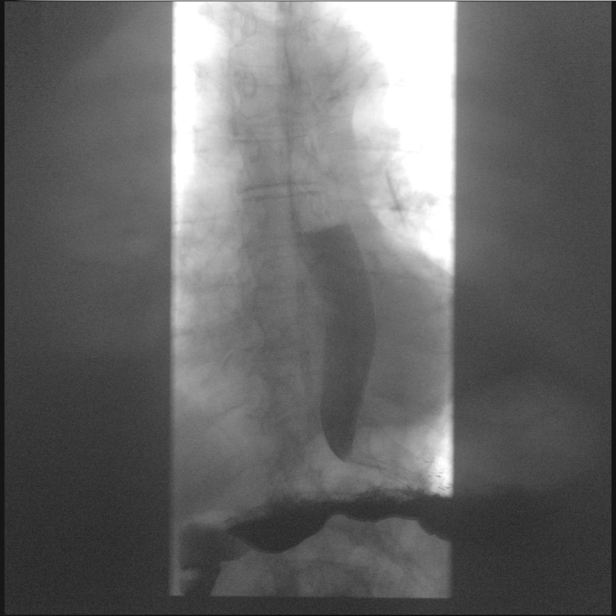
[frame 195/229]
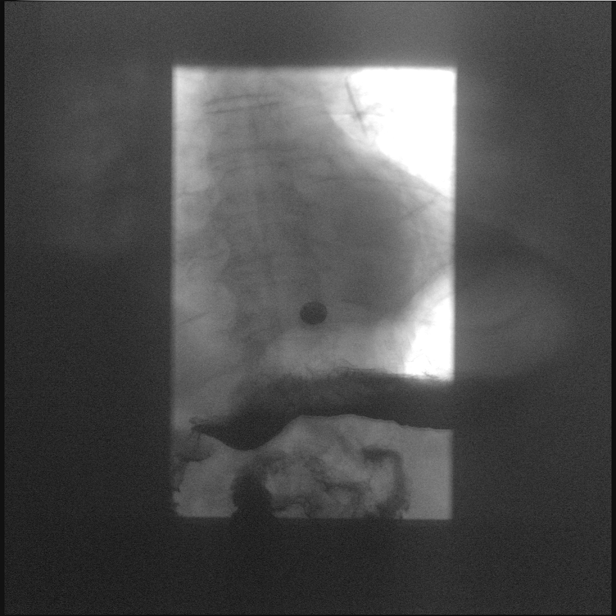

[Series 8: cp_standard · 0.26mm/px · 1 of 70 frames shown (7 of 10)]
[frame 36/70]
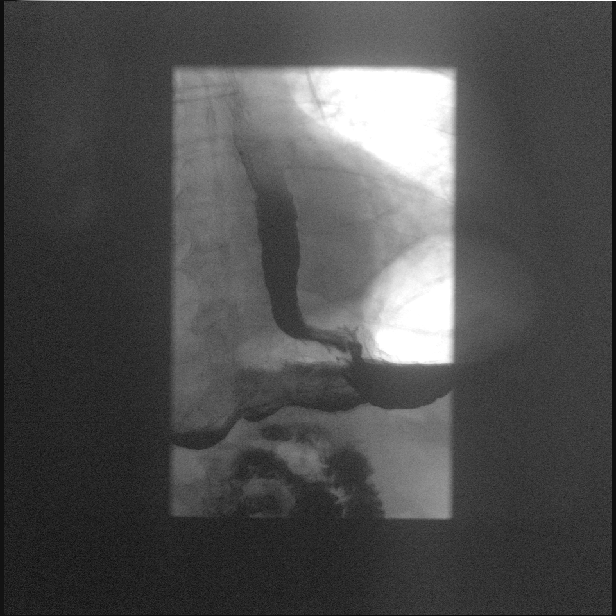

[Series 9: cp_standard · 0.28mm/px · 2 of 93 frames shown (8 of 10)]
[frame 14/93]
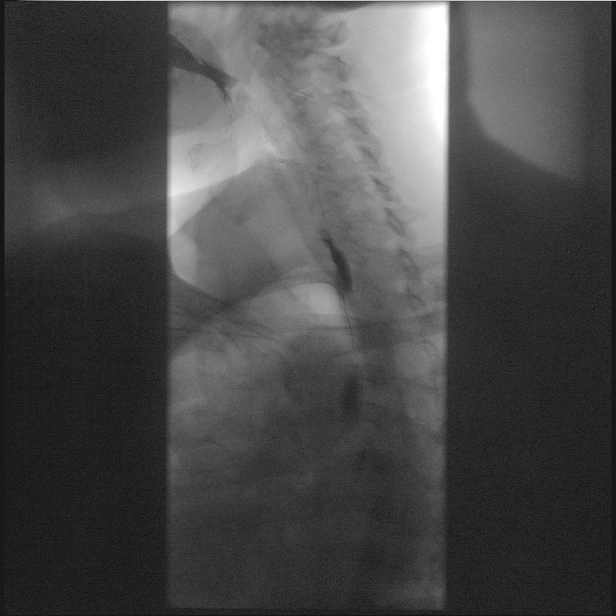
[frame 80/93]
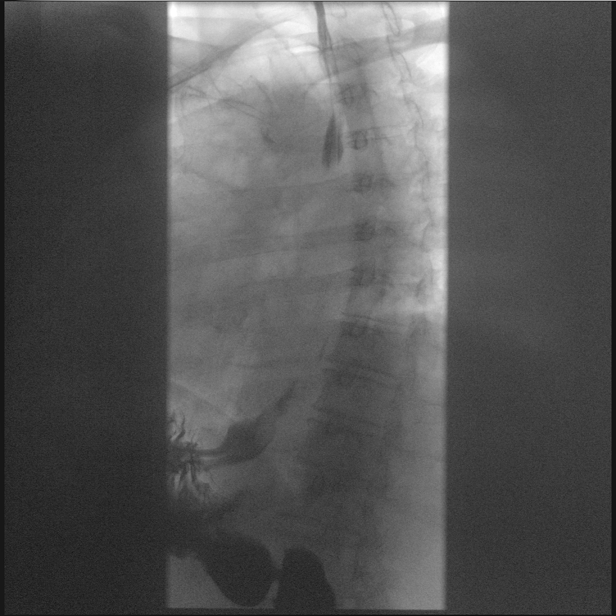

[Series 10: cp_standard · 0.28mm/px · 1 of 147 frames shown (9 of 10)]
[frame 74/147]
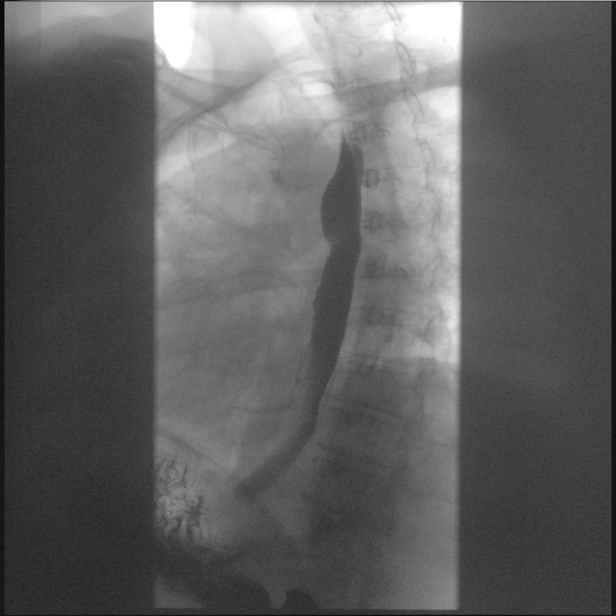

[Series 11: cp_standard · 0.28mm/px · 2 of 104 frames shown (10 of 10)]
[frame 16/104]
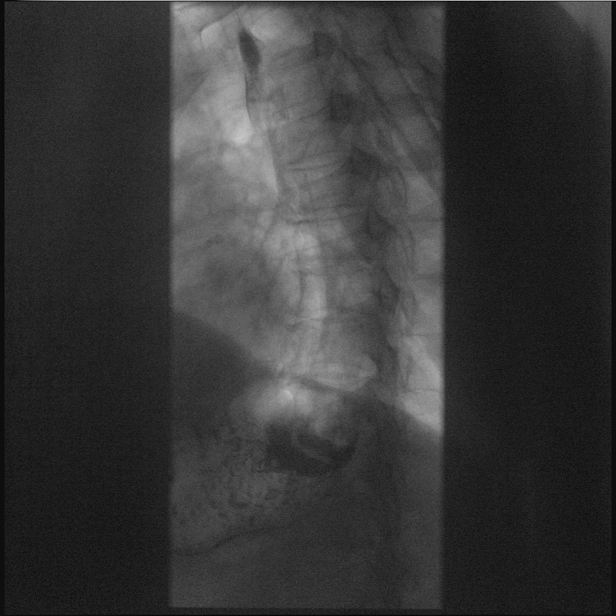
[frame 89/104]
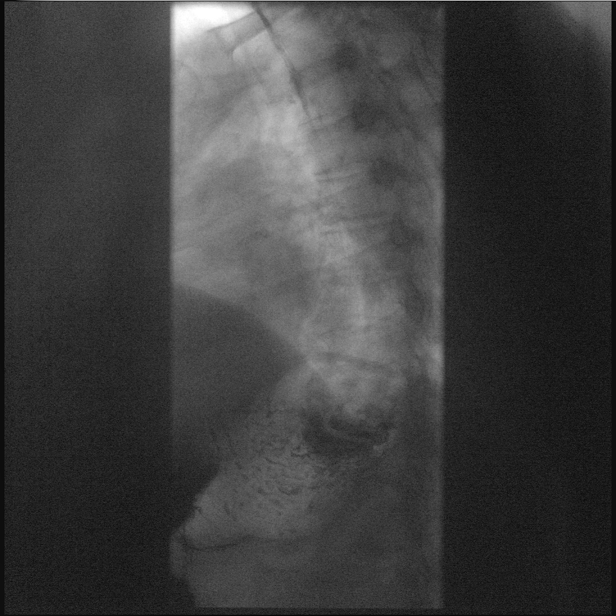

[14 of 24 positions shown; findings below may reference images not displayed]

PROCEDURE:
There is mild irregularity of the ventral hypopharyngeal contours
near the upper esophageal sphincter without stricture. Normal
pharyngeal motility. No laryngeal penetration or tracheal
aspiration. Contrast flowed freely through the esophagus without
evidence of a stricture or mass. Normal esophageal mucosa without
evidence of irregularity or ulceration. No significant esophageal
dysmotility is seen; intermittent tertiary contractions are seen
that are asymptomatic. No evidence of reflux. No definite hiatal
hernia was demonstrated.

A 13 mm barium tablet was administered which transited through the
esophagus and esophagogastric junction without delay.

COMPLICATIONS:
NONE.
IMPRESSION: Mild irregularity of the ventral margin of the hypopharynx near the
upper esophageal sphincter. Recommend direct visual inspection to
exclude a lesion.

No mass, stricture, significant dysmotility, or gastroesophageal
reflux.

This exam was performed by Mario Wilson Beto, and was supervised
and interpreted by Dr. Jumper.

## 2024-01-18 ENCOUNTER — Other Ambulatory Visit: Payer: Self-pay | Admitting: Orthopedic Surgery

## 2024-01-18 DIAGNOSIS — M4807 Spinal stenosis, lumbosacral region: Secondary | ICD-10-CM

## 2024-01-18 DIAGNOSIS — T1590XA Foreign body on external eye, part unspecified, unspecified eye, initial encounter: Secondary | ICD-10-CM

## 2024-01-18 DIAGNOSIS — G8929 Other chronic pain: Secondary | ICD-10-CM

## 2024-01-24 ENCOUNTER — Encounter: Payer: Self-pay | Admitting: Orthopedic Surgery

## 2024-01-28 ENCOUNTER — Inpatient Hospital Stay: Admission: RE | Admit: 2024-01-28 | Source: Ambulatory Visit

## 2024-04-27 ENCOUNTER — Encounter: Payer: Self-pay | Admitting: Orthopedic Surgery
